# Patient Record
Sex: Female | Born: 1955 | Race: Black or African American | Hispanic: No | Smoking: Former smoker
Health system: Southern US, Community
[De-identification: ages and names within clinical notes are randomized; demographics above are authoritative.]

## PROBLEM LIST (undated history)

## (undated) DIAGNOSIS — C50919 Malignant neoplasm of unspecified site of unspecified female breast: Secondary | ICD-10-CM

## (undated) DIAGNOSIS — I10 Essential (primary) hypertension: Secondary | ICD-10-CM

## (undated) DIAGNOSIS — B1920 Unspecified viral hepatitis C without hepatic coma: Secondary | ICD-10-CM

## (undated) DIAGNOSIS — Z923 Personal history of irradiation: Secondary | ICD-10-CM

## (undated) DIAGNOSIS — K219 Gastro-esophageal reflux disease without esophagitis: Secondary | ICD-10-CM

## (undated) DIAGNOSIS — A150 Tuberculosis of lung: Secondary | ICD-10-CM

## (undated) HISTORY — PX: TUBAL LIGATION: SHX77

## (undated) HISTORY — PX: MANDIBLE FRACTURE SURGERY: SHX706

## (undated) HISTORY — DX: Essential (primary) hypertension: I10

## (undated) HISTORY — PX: BREAST SURGERY: SHX581

## (undated) HISTORY — PX: THYROID CYST EXCISION: SHX2511

## (undated) HISTORY — DX: Unspecified viral hepatitis C without hepatic coma: B19.20

## (undated) HISTORY — DX: Malignant neoplasm of unspecified site of unspecified female breast: C50.919

## (undated) HISTORY — DX: Tuberculosis of lung: A15.0

---

## 1998-06-18 ENCOUNTER — Encounter: Payer: Self-pay | Admitting: Family Medicine

## 1998-06-18 ENCOUNTER — Ambulatory Visit (HOSPITAL_COMMUNITY): Admission: RE | Admit: 1998-06-18 | Discharge: 1998-06-18 | Payer: Self-pay | Admitting: Family Medicine

## 1999-01-17 ENCOUNTER — Ambulatory Visit (HOSPITAL_COMMUNITY): Admission: RE | Admit: 1999-01-17 | Discharge: 1999-01-17 | Payer: Self-pay | Admitting: Family Medicine

## 1999-01-17 ENCOUNTER — Encounter: Payer: Self-pay | Admitting: Family Medicine

## 1999-01-28 ENCOUNTER — Encounter: Payer: Self-pay | Admitting: Family Medicine

## 1999-01-28 ENCOUNTER — Ambulatory Visit (HOSPITAL_COMMUNITY): Admission: RE | Admit: 1999-01-28 | Discharge: 1999-01-28 | Payer: Self-pay | Admitting: Family Medicine

## 1999-06-13 ENCOUNTER — Encounter: Payer: Self-pay | Admitting: Family Medicine

## 1999-06-13 ENCOUNTER — Encounter: Admission: RE | Admit: 1999-06-13 | Discharge: 1999-06-13 | Payer: Self-pay | Admitting: Family Medicine

## 2000-06-07 ENCOUNTER — Other Ambulatory Visit: Admission: RE | Admit: 2000-06-07 | Discharge: 2000-06-07 | Payer: Self-pay | Admitting: Otolaryngology

## 2000-07-16 ENCOUNTER — Ambulatory Visit (HOSPITAL_BASED_OUTPATIENT_CLINIC_OR_DEPARTMENT_OTHER): Admission: RE | Admit: 2000-07-16 | Discharge: 2000-07-16 | Payer: Self-pay | Admitting: Otolaryngology

## 2000-07-16 ENCOUNTER — Encounter (INDEPENDENT_AMBULATORY_CARE_PROVIDER_SITE_OTHER): Payer: Self-pay | Admitting: Specialist

## 2005-02-14 ENCOUNTER — Ambulatory Visit (HOSPITAL_COMMUNITY): Admission: RE | Admit: 2005-02-14 | Discharge: 2005-02-14 | Payer: Self-pay | Admitting: Family Medicine

## 2005-06-29 ENCOUNTER — Other Ambulatory Visit: Admission: RE | Admit: 2005-06-29 | Discharge: 2005-06-29 | Payer: Self-pay | Admitting: Family Medicine

## 2007-04-28 ENCOUNTER — Observation Stay (HOSPITAL_COMMUNITY): Admission: EM | Admit: 2007-04-28 | Discharge: 2007-04-30 | Payer: Self-pay | Admitting: Emergency Medicine

## 2008-12-01 ENCOUNTER — Encounter: Admission: RE | Admit: 2008-12-01 | Discharge: 2008-12-01 | Payer: Self-pay | Admitting: Family Medicine

## 2009-05-10 ENCOUNTER — Other Ambulatory Visit: Admission: RE | Admit: 2009-05-10 | Discharge: 2009-05-10 | Payer: Self-pay | Admitting: Family Medicine

## 2009-08-26 ENCOUNTER — Ambulatory Visit: Payer: Self-pay | Admitting: Gastroenterology

## 2009-09-29 ENCOUNTER — Ambulatory Visit (HOSPITAL_COMMUNITY): Admission: RE | Admit: 2009-09-29 | Discharge: 2009-09-29 | Payer: Self-pay | Admitting: Gastroenterology

## 2009-10-28 ENCOUNTER — Ambulatory Visit: Payer: Self-pay | Admitting: Gastroenterology

## 2010-07-11 LAB — PROTIME-INR
INR: 0.92 (ref 0.00–1.49)
Prothrombin Time: 12.3 seconds (ref 11.6–15.2)

## 2010-07-11 LAB — CBC
HCT: 44.1 % (ref 36.0–46.0)
Hemoglobin: 14.6 g/dL (ref 12.0–15.0)
MCHC: 33.2 g/dL (ref 30.0–36.0)
MCV: 93.4 fL (ref 78.0–100.0)
Platelets: 130 10*3/uL — ABNORMAL LOW (ref 150–400)
RBC: 4.72 MIL/uL (ref 3.87–5.11)
RDW: 12.1 % (ref 11.5–15.5)
WBC: 7.2 10*3/uL (ref 4.0–10.5)

## 2010-07-11 LAB — APTT: aPTT: 29 seconds (ref 24–37)

## 2010-07-11 LAB — GLUCOSE, CAPILLARY
Glucose-Capillary: 88 mg/dL (ref 70–99)
Glucose-Capillary: 91 mg/dL (ref 70–99)

## 2010-09-06 NOTE — H&P (Signed)
NAME:  Sharon, Nguyen NO.:  [id number]   MEDICAL RECORD NO.:  [id number]          PATIENT TYPE:  EMS   LOCATION:  MAJO                         FACILITY:  [facility]   PHYSICIAN:  Marlan Palau, M.D.  DATE OF BIRTH:  November 21, 1955   DATE OF ADMISSION:  04/28/2007  DATE OF DISCHARGE:                              HISTORY & PHYSICAL   PRESENT ILLNESS:  Sharon Nguyen is a 55 year old right-handed black female  born Jun 06, 1955, with a history of hypertension.  This patient  comes to the [facility] emergency room noting onset around 5:30 p.m. of  some left neck and chest pain.  The patient claims to have waves of  chest pain coming on that last about 5 minutes and then spread to the  neck, into the head and into the back of the shoulder on the left.  The  patient may have some discomfort into the left hand with some numbness  in the hand but also feels somewhat heavy in the left arm but also has  some discomfort or numbness sensation down the left side of the body.  The patient has had EKG that was normal and a Code Stroke was called  because of the left-sided symptoms.  CT scan of the brain was done  and  is unremarkable.  The patient has an NIH Stroke Scale score of 2 for  some altered sensation in the left hand and some drift in the left leg.  The patient denies any visual field change, speech changes, swallowing  problems, gait disturbance.   PAST MEDICAL HISTORY:  1. New onset of left-sided neck, shoulder, chest pain, left hand      numbness.  NIH Stroke Scale of 2.  Cause of the above symptoms      unclear.  2. Hypertension.  3. 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  4. Bilateral great toe surgery.  5. Bilateral tubal ligation.  6. Questionable history of atrial fibrillation.  The patient was told      she had an irregular heartbeat in the past.   PRIMARY PHYSICIAN:  Molly Maduro A. Nicholos Johns, M.D.   The patient is on:  1. Amlodipine 10 mg daily.  2.  Lisinopril/HCT 20/25 mg one daily.   Does not smoke.  Drinks alcohol on occasion.   Has no known allergies.   SOCIAL HISTORY:  This patient is living in the [place], [place], area.  Is married and has four sons, who are alive and well.  Works at [employer].   FAMILY MEDICAL HISTORY:  The mother is alive.  Father died of cancer and  stroke.  The patient has eight brothers and sisters, some with  hypertension.   REVIEW OF SYSTEMS:  Notable for no recent fevers, chills.  The patient  does have some left-sided the head pain, left neck pain.  Denies any  shortness of breath.  Does have chest pain.  Denies abdominal pain,  nausea, vomiting, troubles controlling bowels or bladder, blackout  episodes, seizure events.   PHYSICAL EXAMINATION:  VITALS:  Blood pressure is 150/99, heart rate 92,  respiratory rate 20, temperature afebrile.  GENERAL:  The patient is a fairly well-developed black female who is  alert and cooperative at the time of examination.  HEENT:  Head is atraumatic.  Eyes: Pupils equal, round and react to  light.  Discs are flat bilaterally.  NECK:  Supple.  No carotid bruits noted.  RESPIRATORY:  Clear.  CARDIOVASCULAR:  A regular rate and rhythm.  No obvious murmurs or rubs  noted.  EXTREMITIES:  Without significant edema.  NEUROLOGIC:  Cranial nerves as above.  Facial symmetry is present.  The  patient has good sensation of the face to pinprick and soft touch  bilaterally.  Has good strength of the facial muscles and the muscles of  head turning and shoulder shrug bilaterally.  Speech is well-enunciated  and not aphasic.  Extraocular movements, again, are full.  Visual fields  are full.  Motor testing reveals fairly good strength in all fours.  The  patient has some difficulty lifting the left leg, however.  Some drift  was noted on the left leg.  No drift was noted on the left arm.  On  sensory examination the patient had decreased pinprick and  vibratory  sensation on the left hand, normal on the right, some decreased  vibratory sensation in the left leg as compared to the right, otherwise  symmetric pinprick sensation on the legs.  The patient has good finger-  nose-finger, heel-to-shin.  Gait was not tested.  Deep tendon reflexes  symmetric and normal.  Toes downgoing bilaterally.  NIH stroke Scale  Score  of 2.  No aphasia noted.   LABORATORY VALUES:  Notable for a white count of 6.9, hemoglobin of  14.6, hematocrit 43.5, MCV of 89.7, platelets of 155.  INR of 0.9.  Sodium of 140, potassium 3.3, chloride of 101, CO2 of 28, glucose of 92,  BUN of 14, creatinine 0.91, total bili 0.5, alkaline phosphatase of 69,  SGOT of 49, SGPT of 67, total protein of 7.9, albumin of 4.3, calcium  10.0.  CK of 113, MB fraction 1.7, troponin-I 0.02, MB fraction less  than 1.   CT of the head as above.   IMPRESSION:  1. Onset of left-sided chest pain, shoulder pain, and some left-sided      symptoms of heaviness, etiology unclear.  2. History of hypertension.   This patient has developed symptoms of chest pain that is of unclear  cause.  EKG is completely normal.  Cardiac enzymes are negative.  There  does not appear to be a definite cardiac source of the above chest pain.  Need to rule out neck pathology or even demyelinating disease.  I  suppose stroke is a consideration but I think is less likely.  Will  pursue further workup at this point.   PLAN:  1. Admission to [facility].  2. MRI of the brain with intracranial MRI angiogram.  3. MRI scan of the cervical spine with and without gadolinium.  4. Admission blood.  5. Aspirin therapy.   We will follow the patient's clinical course while in-house.  The  patient claims she has had some occasional bouts of left-sided chest  pain since Thanksgiving of 2008.      Marlan Palau, M.D.  Electronically Signed     CKW/MEDQ  D:  04/28/2007  T:  04/29/2007  Job:  [id number]    cc:   Molly Maduro A. Nicholos Johns, M.D.  [facility]

## 2010-09-09 NOTE — Op Note (Signed)
[facility]  Patient:    Sharon Nguyen, Sharon Nguyen                         MRN: [id number] Proc. Date: 07/16/00 Adm. Date:  [id number] Attending:  Serena Colonel H                           Operative Report  PREOPERATIVE DIAGNOSIS:  Left supraclavicular neck mass.  POSTOPERATIVE DIAGNOSIS:  Left supraclavicular neck mass.  PROCEDURE:  Excisional biopsy, left supraclavicular lymph node.  SURGEON:  Jefry H. Pollyann Kennedy, M.D.  ANESTHESIA:  General endotracheal anesthesia was used.  COMPLICATIONS:  No complications.  FINDINGS:  Conglomeration of matted, enlarged, firm lymph nodes, left supraclavicular area.  ESTIMATED BLOOD LOSS:  Less than 5 cc.  FROZEN SECTION DIAGNOSIS:  Granulomatous tissue.  No evidence of malignancy or lymphoma, and final diagnosis deferred until permanent section evaluation and cultures.  HISTORY:  This is a 56 year old lady with a several-month history of slowly enlarging left supraclavicular neck mass.  Fine needle aspiration biopsy revealed granulomatous-type cells.  Risks, benefits, alternatives, and complications of the procedure were explained to the patient, who seemed to understand and agreed to surgery.  DESCRIPTION OF PROCEDURE:  The patient was taken to the operating room and placed on the operating table in supine position.  Following induction of general endotracheal anesthesia, the neck was prepped and draped in a standard fashion.  A 3 cm transverse incision just above the left clavicle was created with a 15 scalpel.  Electrocautery was used to dissect through subcutaneous tissue and fascia and through the fibers of the platysma muscle.  The deep fascia was divided, and the lymph nodes were identified and carefully dissected from surrounding tissue and sent for pathologic evaluation. Electrocautery was used for hemostasis.  The wound was closed in layers using 4-0 chromic in the deep layer and the subcutaneous layer.  The  skin was reapproximated with benzoin and Steri-Strips.  The patient was then awakened, transferred to recovery in stable condition. DD:  07/16/00 TD:  07/16/00 Job: [id number]

## 2010-09-09 NOTE — Discharge Summary (Signed)
NAME:  Sharon Nguyen, Sharon Nguyen NO.:  [id number]   MEDICAL RECORD NO.:  [id number]          PATIENT TYPE:  INP   LOCATION:  6733                         FACILITY:  [facility]   PHYSICIAN:  Marlan Palau, M.D.  DATE OF BIRTH:  1955/12/23   DATE OF ADMISSION:  04/28/2007  DATE OF DISCHARGE:  04/30/2007                               DISCHARGE SUMMARY   ADMISSION DIAGNOSIS:  1. Left-sided neck, shoulder, chest pain, left hand numbness, etiology      unclear.  2. Hypertension.  3. Questionable history of atrial fibrillation.   DISCHARGE DIAGNOSIS:  1. Left-sided neck, shoulder, chest pain, left hand numbness, etiology      unclear.  2. Hypertension.  3. Questionable history of atrial fibrillation.   PROCEDURES DURING THIS ADMISSION:  Include:  1. CT of the head.  2. MRI of the brain.  3. MRI angiogram.  4. Cervical spine MRI.  5. Carotid Doppler study.  6. 2-D echocardiogram.   COMPLICATIONS OF ABOVE PROCEDURES:  None.   HISTORY OF PRESENT ILLNESS:  Sharon Nguyen is a 55 year old right-handed  black female born 04-25-1955 with a history of hypertension.  The  patient came to the [facility] emergency room complaining of some left  neck and chest pain and had some pain into the back of the shoulder on  the left as well.  Chest pain would come in waves lasting about 5  minutes and then dissipated.  The patient also has a sensation of  numbness in the left side of the body, questionable weakness on left  side a code stroke was called on admission. CT scan of brain was done  and was unremarkable.  NIH stroke scale score was two.  The patient was  brought in the hospital for further evaluation.  EKG was normal.  Cardiac enzymes were normal.   PAST MEDICAL HISTORY:  1. New onset left-sided neck pain and shoulder, some left hand      numbness, etiology unclear.  2. Hypertension.  3. Lymph node resection for granuloma in the past.  4. Bilateral great toe surgery.  5.  Bilateral tubal ligation.  6. Questionable history atrial fibrillation in the past.   MEDICATIONS ON ADMISSION:  Include:  1. Amlodipine 10 mg daily.  2. Lisinopril/hydrochlorothiazide 20/25 mg one daily.   SOCIAL HISTORY:  The patient does not smoke, drinks alcohol on occasion  has no known allergies.   Please refer to history and physical dictation summary for social  history, family history or review of systems and physical examination.   LABORATORY VALUES:  Are notable for a with white count of 6.9, a  hemoglobin 14.6, hematocrit 43.5, MCV of 89.7, platelets 155, INR of  0.9, sodium of 140, potassium 3.3, chloride of 101, CO2 28, glucose of  92, BUN of 14, creatinine of 0.91, total bili 0.5, alkaline phosphatase  of 69, SGOT of 49, SGPT of 67, total protein 7.9, albumin of 4.3,  calcium of 10, phos of 4.2, CK 92, MB fraction 1.1 and troponin I 0.03.   ANA is negative.  TSH 1.698.  Vitamin B12 level of 497.   HOSPITAL COURSE:  This patient was admitted to [facility] for  evaluation of the above symptoms.  The patient was set up for an MRI  study of the brain that was unremarkable.  MRI angiogram of intracranial  vessels showed questionable loss in the distal A1 segment that was felt  to be artifactual. MRI of the cervical spine was also done showing a  central disk protrusion with moderate central canal stenosis, right  greater than left at C3 level some spurring at C4-5 level with bi-  foraminal narrowing, moderate right and mild left foraminal narrowing at  C5-6 level, mild left foraminal narrowing C6-7 level.  The patient was  set up for CT angiogram of the chest to rule out pulmonary embolus, but  this was unremarkable.  Some small left upper lobe nodules were noted.  The patient underwent a 2-D echocardiogram and this revealed an ejection  fraction of 60 to 65%, mild mitral valvular regurgitation.  No embolic  source was noted.  A carotid Doppler study was also  performed and shows  mild intimal wall changes in the common carotid arteries bilaterally.  Vertebral artery flow was antegrade bilaterally.  No evidence of  internal carotid artery stenosis was seen on either side the patient was  subsequently discharged taking amlodipine 10 mg daily,  lisinopril/hydrochlorothiazide 20/25 mg daily.  The patient is placed on  potassium supplementation 10 mEq, 30 tablets given, and three refills  take one a day.  The patient was placed on aspirin therapy 81 mg daily.  The patient will follow-up with her primary care Dr. Fulton Mole.  The  patient will be seen at.  [facility] if needed. At  time of discharge, the patient is bright, alert, cooperative, has no  focal deficits chest pain had resolved.      Marlan Palau, M.D.  Electronically Signed     CKW/MEDQ  D:  05/01/2007  T:  05/02/2007  Job:  [id number]   cc:   [facility]  Fulton Mole, M.D.

## 2011-01-11 LAB — CBC
HCT: 43.5
Hemoglobin: 14.6
MCHC: 33.7
MCV: 89.7
Platelets: 155
RBC: 4.84
RDW: 12.8
WBC: 6.9

## 2011-01-11 LAB — ANGIOTENSIN CONVERTING ENZYME: Angiotensin-Converting Enzyme: 21 U/L (ref 9–67)

## 2011-01-11 LAB — DIFFERENTIAL
Basophils Absolute: 0
Basophils Relative: 0
Eosinophils Absolute: 0.2
Eosinophils Relative: 2
Lymphocytes Relative: 28
Lymphs Abs: 1.9
Monocytes Absolute: 0.6
Monocytes Relative: 8
Neutro Abs: 4.3
Neutrophils Relative %: 62

## 2011-01-11 LAB — COMPREHENSIVE METABOLIC PANEL
ALT: 67 — ABNORMAL HIGH
AST: 49 — ABNORMAL HIGH
Albumin: 4.3
Alkaline Phosphatase: 69
BUN: 14
CO2: 28
Calcium: 10
Chloride: 101
Creatinine, Ser: 0.91
GFR calc non Af Amer: >60
Glucose, Bld: 92
Potassium: 3.3 — ABNORMAL LOW
Sodium: 140
Total Bilirubin: 0.5
Total Protein: 7.9

## 2011-01-11 LAB — BASIC METABOLIC PANEL
BUN: 10
BUN: 11
CO2: 27
CO2: 27
Calcium: 9.1
Calcium: 9.2
Chloride: 102
Chloride: 106
Creatinine, Ser: 0.71
Creatinine, Ser: 0.81
GFR calc Af Amer: >60
GFR calc non Af Amer: >60
GFR calc non Af Amer: >60
Glucose, Bld: 90
Glucose, Bld: 94
Potassium: 3.4 — ABNORMAL LOW
Potassium: 3.8
Sodium: 134 — ABNORMAL LOW
Sodium: 141

## 2011-01-11 LAB — SEDIMENTATION RATE: Sed Rate: 6

## 2011-01-11 LAB — PROTIME-INR
INR: 0.9
Prothrombin Time: 12.1

## 2011-01-11 LAB — RENAL FUNCTION PANEL
Albumin: 3.9
BUN: 10
CO2: 29
Calcium: 9.4
Chloride: 105
Creatinine, Ser: 0.8
GFR calc Af Amer: >60
GFR calc non Af Amer: >60
Glucose, Bld: 87
Phosphorus: 4.2
Potassium: 3.6
Sodium: 140

## 2011-01-11 LAB — POCT CARDIAC MARKERS
CKMB, poc: 1.1
CKMB, poc: <1 — ABNORMAL LOW
Myoglobin, poc: 60.3
Myoglobin, poc: 87.5
Operator id: 294341
Operator id: 294341
Troponin i, poc: <0.05
Troponin i, poc: <0.05

## 2011-01-11 LAB — TROPONIN I: Troponin I: 0.03

## 2011-01-11 LAB — APTT: aPTT: 27

## 2011-01-11 LAB — CK TOTAL AND CKMB (NOT AT ARMC)
CK, MB: 1.1
CK, MB: 1.7
Relative Index: 1.5
Relative Index: INVALID
Total CK: 113
Total CK: 92

## 2011-01-11 LAB — TSH: TSH: 1.698

## 2011-01-11 LAB — ANA: Anti Nuclear Antibody(ANA): NEGATIVE

## 2011-01-11 LAB — VITAMIN B12: Vitamin B-12: 497 (ref 211–911)

## 2011-02-27 ENCOUNTER — Other Ambulatory Visit (HOSPITAL_COMMUNITY): Payer: Self-pay | Admitting: Family Medicine

## 2011-02-27 DIAGNOSIS — Z1231 Encounter for screening mammogram for malignant neoplasm of breast: Secondary | ICD-10-CM

## 2011-03-08 ENCOUNTER — Other Ambulatory Visit (HOSPITAL_COMMUNITY)
Admission: RE | Admit: 2011-03-08 | Discharge: 2011-03-08 | Disposition: A | Discharge status: Home / Self Care | Payer: BC Managed Care – PPO | Source: Ambulatory Visit | Attending: Family Medicine | Admitting: Family Medicine

## 2011-03-08 ENCOUNTER — Other Ambulatory Visit: Payer: Self-pay | Admitting: Family Medicine

## 2011-03-08 DIAGNOSIS — Z01419 Encounter for gynecological examination (general) (routine) without abnormal findings: Secondary | ICD-10-CM | POA: Insufficient documentation

## 2011-03-24 ENCOUNTER — Ambulatory Visit (HOSPITAL_COMMUNITY)
Admission: RE | Admit: 2011-03-24 | Discharge: 2011-03-24 | Disposition: A | Discharge status: Home / Self Care | Payer: BC Managed Care – PPO | Source: Ambulatory Visit | Attending: Family Medicine | Admitting: Family Medicine

## 2011-03-24 DIAGNOSIS — Z1231 Encounter for screening mammogram for malignant neoplasm of breast: Secondary | ICD-10-CM | POA: Insufficient documentation

## 2011-04-03 ENCOUNTER — Other Ambulatory Visit: Payer: Self-pay | Admitting: Family Medicine

## 2011-04-03 DIAGNOSIS — R928 Other abnormal and inconclusive findings on diagnostic imaging of breast: Secondary | ICD-10-CM

## 2011-04-13 ENCOUNTER — Inpatient Hospital Stay: Admission: RE | Admit: 2011-04-13 | Payer: BC Managed Care – PPO | Source: Ambulatory Visit

## 2011-05-03 ENCOUNTER — Ambulatory Visit
Admission: RE | Admit: 2011-05-03 | Discharge: 2011-05-03 | Disposition: A | Discharge status: Home / Self Care | Payer: BC Managed Care – PPO | Source: Ambulatory Visit | Attending: Family Medicine | Admitting: Family Medicine

## 2011-05-03 DIAGNOSIS — R928 Other abnormal and inconclusive findings on diagnostic imaging of breast: Secondary | ICD-10-CM

## 2011-10-05 ENCOUNTER — Other Ambulatory Visit: Payer: Self-pay | Admitting: Family Medicine

## 2011-10-05 DIAGNOSIS — N6310 Unspecified lump in the right breast, unspecified quadrant: Secondary | ICD-10-CM

## 2011-11-01 ENCOUNTER — Ambulatory Visit
Admission: RE | Admit: 2011-11-01 | Discharge: 2011-11-01 | Disposition: A | Discharge status: Home / Self Care | Payer: 59 | Source: Ambulatory Visit | Attending: Family Medicine | Admitting: Family Medicine

## 2011-11-01 DIAGNOSIS — N6310 Unspecified lump in the right breast, unspecified quadrant: Secondary | ICD-10-CM

## 2012-04-19 ENCOUNTER — Other Ambulatory Visit: Payer: Self-pay | Admitting: Family Medicine

## 2012-04-19 DIAGNOSIS — R922 Inconclusive mammogram: Secondary | ICD-10-CM

## 2012-04-24 HISTORY — PX: BREAST LUMPECTOMY: SHX2

## 2012-04-30 ENCOUNTER — Other Ambulatory Visit: Payer: Self-pay | Admitting: Family Medicine

## 2012-04-30 ENCOUNTER — Ambulatory Visit
Admission: RE | Admit: 2012-04-30 | Discharge: 2012-04-30 | Disposition: A | Discharge status: Home / Self Care | Payer: 59 | Source: Ambulatory Visit | Attending: Family Medicine | Admitting: Family Medicine

## 2012-04-30 DIAGNOSIS — R922 Inconclusive mammogram: Secondary | ICD-10-CM

## 2012-04-30 DIAGNOSIS — R9230 Dense breasts, unspecified: Secondary | ICD-10-CM

## 2012-04-30 DIAGNOSIS — N63 Unspecified lump in unspecified breast: Secondary | ICD-10-CM

## 2012-06-04 ENCOUNTER — Inpatient Hospital Stay: Admission: RE | Admit: 2012-06-04 | Payer: 59 | Source: Ambulatory Visit

## 2012-06-10 ENCOUNTER — Ambulatory Visit
Admission: RE | Admit: 2012-06-10 | Discharge: 2012-06-10 | Disposition: A | Discharge status: Home / Self Care | Payer: 59 | Source: Ambulatory Visit | Attending: Family Medicine | Admitting: Family Medicine

## 2012-06-10 DIAGNOSIS — N63 Unspecified lump in unspecified breast: Secondary | ICD-10-CM

## 2012-06-13 ENCOUNTER — Telehealth: Payer: Self-pay | Admitting: *Deleted

## 2012-06-13 DIAGNOSIS — C50519 Malignant neoplasm of lower-outer quadrant of unspecified female breast: Secondary | ICD-10-CM | POA: Insufficient documentation

## 2012-06-13 DIAGNOSIS — C50511 Malignant neoplasm of lower-outer quadrant of right female breast: Secondary | ICD-10-CM

## 2012-06-13 NOTE — Telephone Encounter (Signed)
Confirmed [facility] for 06/19/12 at 1200 .  Instructions and contact information given. 

## 2012-06-19 ENCOUNTER — Other Ambulatory Visit: Payer: 59 | Admitting: Lab

## 2012-06-19 ENCOUNTER — Ambulatory Visit: Payer: 59

## 2012-06-19 ENCOUNTER — Ambulatory Visit: Payer: 59 | Admitting: Radiation Oncology

## 2012-06-19 ENCOUNTER — Ambulatory Visit (INDEPENDENT_AMBULATORY_CARE_PROVIDER_SITE_OTHER): Payer: 59 | Admitting: General Surgery

## 2012-06-19 ENCOUNTER — Ambulatory Visit: Payer: 59 | Admitting: Oncology

## 2012-06-19 ENCOUNTER — Telehealth: Payer: Self-pay | Admitting: *Deleted

## 2012-06-19 ENCOUNTER — Ambulatory Visit: Payer: 59 | Attending: General Surgery | Admitting: Physical Therapy

## 2012-06-19 NOTE — Telephone Encounter (Signed)
Pt did not show up for her appt so I called to see what was going on and she stated that she wanted to reschedule.  Confirmed 06/26/12 am clinic w/ pt.  Informed team.

## 2012-06-26 ENCOUNTER — Ambulatory Visit (HOSPITAL_BASED_OUTPATIENT_CLINIC_OR_DEPARTMENT_OTHER): Payer: 59 | Admitting: Surgery

## 2012-06-26 ENCOUNTER — Ambulatory Visit (HOSPITAL_BASED_OUTPATIENT_CLINIC_OR_DEPARTMENT_OTHER): Payer: 59 | Admitting: Oncology

## 2012-06-26 ENCOUNTER — Encounter: Payer: Self-pay | Admitting: *Deleted

## 2012-06-26 ENCOUNTER — Encounter: Payer: Self-pay | Admitting: Oncology

## 2012-06-26 ENCOUNTER — Other Ambulatory Visit (HOSPITAL_BASED_OUTPATIENT_CLINIC_OR_DEPARTMENT_OTHER): Payer: 59 | Admitting: Lab

## 2012-06-26 ENCOUNTER — Ambulatory Visit: Payer: 59 | Attending: Surgery | Admitting: Physical Therapy

## 2012-06-26 ENCOUNTER — Ambulatory Visit: Payer: 59

## 2012-06-26 ENCOUNTER — Ambulatory Visit
Admission: RE | Admit: 2012-06-26 | Discharge: 2012-06-26 | Disposition: A | Discharge status: Home / Self Care | Payer: 59 | Source: Ambulatory Visit | Attending: Radiation Oncology | Admitting: Radiation Oncology

## 2012-06-26 VITALS — BP 147/91 | HR 80 | Temp 98.5°F | Resp 20 | Ht 67.25 in | Wt 159.7 lb

## 2012-06-26 DIAGNOSIS — C50511 Malignant neoplasm of lower-outer quadrant of right female breast: Secondary | ICD-10-CM

## 2012-06-26 DIAGNOSIS — C50519 Malignant neoplasm of lower-outer quadrant of unspecified female breast: Secondary | ICD-10-CM

## 2012-06-26 DIAGNOSIS — Z170 Estrogen receptor positive status [ER+]: Secondary | ICD-10-CM

## 2012-06-26 LAB — CBC WITH DIFFERENTIAL/PLATELET
BASO%: 0.9 % (ref 0.0–2.0)
Basophils Absolute: 0.1 10*3/uL (ref 0.0–0.1)
EOS%: 3.9 % (ref 0.0–7.0)
Eosinophils Absolute: 0.2 10*3/uL (ref 0.0–0.5)
HCT: 39.5 % (ref 34.8–46.6)
HGB: 12.6 g/dL (ref 11.6–15.9)
LYMPH%: 23.6 % (ref 14.0–49.7)
MCH: 27.7 pg (ref 25.1–34.0)
MCHC: 31.9 g/dL (ref 31.5–36.0)
MCV: 86.9 fL (ref 79.5–101.0)
MONO#: 0.4 10*3/uL (ref 0.1–0.9)
MONO%: 6.5 % (ref 0.0–14.0)
NEUT#: 3.8 10*3/uL (ref 1.5–6.5)
NEUT%: 65.1 % (ref 38.4–76.8)
Platelets: 156 10*3/uL (ref 145–400)
RBC: 4.54 10*6/uL (ref 3.70–5.45)
RDW: 13.8 % (ref 11.2–14.5)
WBC: 5.9 10*3/uL (ref 3.9–10.3)
lymph#: 1.4 10*3/uL (ref 0.9–3.3)

## 2012-06-26 LAB — COMPREHENSIVE METABOLIC PANEL (CC13)
ALT: 14 U/L (ref 0–55)
AST: 16 U/L (ref 5–34)
Albumin: 4.1 g/dL (ref 3.5–5.0)
Alkaline Phosphatase: 83 U/L (ref 40–150)
BUN: 12.9 mg/dL (ref 7.0–26.0)
CO2: 28 mEq/L (ref 22–29)
Calcium: 10.1 mg/dL (ref 8.4–10.4)
Chloride: 106 mEq/L (ref 98–107)
Creatinine: 0.8 mg/dL (ref 0.6–1.1)
Glucose: 93 mg/dl (ref 70–99)
Potassium: 3.4 mEq/L — ABNORMAL LOW (ref 3.5–5.1)
Sodium: 143 mEq/L (ref 136–145)
Total Bilirubin: 0.49 mg/dL (ref 0.20–1.20)
Total Protein: 8 g/dL (ref 6.4–8.3)

## 2012-06-26 LAB — CANCER ANTIGEN 27.29: CA 27.29: 19 U/mL (ref 0–39)

## 2012-06-26 NOTE — Progress Notes (Signed)
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 female who is seen today for the courtesy of Dr. Ezzard Standing at the [facility]. for evaluation of her T1 N0 invasive mammary carcinoma of the right breast. On 04/30/2012 she underwent short interval followup of a nodule along the right lower quadrant of the posterior right breast. Interval nodule was seen again along the right lower quadrant. Ultrasound she was found to have a cyst at 6:00 and a 4 x 3 x 4 mm hypoechoic nodule at 8:00, 8 cm from the right nipple. This was felt to be smaller than a nodule noted on the mammogram and was not felt to correlate to the mammographic finding. A stereotactic biopsy was recommended and performed on 06/10/2012. This was diagnostic for invasive mammary carcinoma with extravasated mucin. The tumor was felt to be low-grade. The tumor strongly ER/PR positive at 100% with a proliferation marker of 22%. Breast MR is not felt to be indicated. She seen today with Dr. Ezzard Standing and Dr. Welton Flakes.  PREVIOUS RADIATION THERAPY: No   PAST MEDICAL HISTORY:  has a past medical history of Breast cancer; Hypertension; Hepatitis C; and TB (pulmonary tuberculosis).     PAST SURGICAL HISTORY: No past surgical history on file.   FAMILY HISTORY: Her father died at age 54 from mesothelioma. Her mother is alive and well at age 56. Her husband is currently under hospice for cholangiocarcinoma.  SOCIAL HISTORY:  reports that she has quit smoking. She does not have any smokeless tobacco history on file. She reports that she does not drink alcohol or use illicit drugs. Married, 4 children. She  works as an Arts administrator.   ALLERGIES: Review of patient's allergies indicates no known allergies.   MEDICATIONS:  No current outpatient prescriptions on file.   No current facility-administered medications for this encounter.     REVIEW OF SYSTEMS:  Pertinent items are noted in HPI.    PHYSICAL EXAM: Alert and oriented 57 year old African American female appearing younger than her stated age. Wt Readings from Last 3 Encounters:  06/26/12 159 lb 11.2 oz (72.439 kg)   Temp Readings from Last 3 Encounters:  06/26/12 98.5 F (36.9 C) Oral   BP Readings from Last 3 Encounters:  06/26/12 147/91   Pulse Readings from Last 3 Encounters:  06/26/12 80   Head and neck examination: Grossly unremarkable. Nodes: Without palpable cervical, supraclavicular, or axillary lymphadenopathy. Chest: Lungs clear. Back: Without spinal or CVA tenderness. Heart: Regular rate rhythm. Breasts: There is a biopsy wound at approximately 8:00 along the lower outer quadrant of the right breast. No masses are appreciated. Left breast without masses or lesions. Abdomen without hepatomegaly. Extremities: Without edema. Neurologic examination: Grossly nonfocal.    LABORATORY DATA:  Lab Results  Component Value Date   WBC 5.9 06/26/2012   HGB 12.6 06/26/2012   HCT 39.5 06/26/2012   MCV 86.9 06/26/2012   PLT 156 06/26/2012   Lab Results  Component Value Date   NA 143 06/26/2012   K 3.4* 06/26/2012   CL 106 06/26/2012  CO2 28 06/26/2012   Lab Results  Component Value Date   ALT 14 06/26/2012   AST 16 06/26/2012   ALKPHOS 83 06/26/2012   BILITOT 0.49 06/26/2012      IMPRESSION: Total stage I (T1, N0, M0) invasive mammary carcinoma of the right breast. We discussed local management options which include mastectomy versus partial mastectomy/lumpectomy followed by radiation therapy. She is interested in breast preservation. We also discussed short course/hypo-fractionated treatment versus standard fractionation. I feel that  she would be a good candidate for hypo-fractionated radiation therapy. We discussed the potential acute and late toxicities of radiation therapy. She'll be scheduled for definitive surgery and Oncotype DX testing.    PLAN: As discussed above. I would like to see her to 3 weeks following her partial mastectomy/sentinel lymph node biopsy   I spent 40 minutes minutes face to face with the patient and more than 50% of that time was spent in counseling and/or coordination of care.

## 2012-06-26 NOTE — Progress Notes (Addendum)
Re:   Sharon Nguyen DOB:   01/17/1956 MRN:   [id number]  [facility]  ASSESSMENT AND PLAN: 1.  Right Breast Cancer  Mammogram - 1.4 cm  MRI - 06/21/2012  ER - , PR - , Her2Neu - neg, K67 - 22%  Oncologists:  Drs. Dayton Scrape and [facility].  I discussed the options for breast cancer treatment with the patient. The patient is in the multidisciplinary clinic to discuss a multidisciplinary approach to the treatment of breast cancer, which includes medical oncology and radiation oncology.  I discussed the surgical options of lumpectomy vs. mastectomy.  She is a candidate for lumpectomy.  I discussed the options of lymph node biopsy.  The treatment plan depends on the pathologic staging of the tumor and the patient's personal wishes.  The risks of surgery include, but are not limited to, bleeding, infection, the need for further surgery, and nerve injury.  The patient has been given literature on the treatment of breast cancer.  Plan:  1.) Lumpectomy (needle loc) and right axillary sentinel lymph node biopsy, 2.) Oncotype  [Patient cancelled surgery for 07/30/2012.  I tried to call patient, but got only answering machine.  DN  07/29/2012]  2.  Hypertension 3.  Hepatitis C - treated through [facility] - May 2011  She says she is disease free 4.  Treated for TB off lymph node biopsy - 2006/07 5.  Husband in hospice for "bile duct cancer"  No chief complaint on file.  REFERRING PHYSICIAN: Lolita Patella, MD  HISTORY OF PRESENT ILLNESS: Sharon Nguyen is a 58 y.o. (DOB: 05-28-1955)  AA  female whose primary care physician is Lolita Patella, MD and comes to the [facility] for right breast cancer.  The patient has had routine mammography. In fact about a year ago, to get her mammograms more frequently. She had a mammogram on 04/30/2012 showed an ill-defined nodule in the right lower outer quadrant. She had a biopsy on 06/10/2012 showed an invasive mammary carcinoma. There is no indication for an  MRI.  Her last period was around 2003 when she was 57 years old. She is not on hormone medication. She's had no prior history of breast disease or breast biopsy.  Her husband is in hospice for a bile duct cancer. He is doing poorly and this occupies much of her time. Because of her husband's illness, she is not told anyone in her family about her breast cancer.   No past medical history on file.   No past surgical history on file.    No current outpatient prescriptions on file.   No current facility-administered medications for this visit.     Allergies no known allergies  REVIEW OF SYSTEMS: Skin:  No history of rash.   Infection: History of TB and Hep C.  She was treated at [facility] for the Hep C, but does not remember the physician's name. Neurologic:  No history of stroke.  No history of seizure.  No history of headaches. Cardiac:  Hypertension x 20 years. No history of heart disease.  No history of prior cardiac catheterization.  No history of seeing a cardiologist. Pulmonary:  Does not smoke cigarettes.  No asthma or bronchitis.  No OSA/CPAP.  Endocrine:  No diabetes. No thyroid disease. Gastrointestinal:  No history of stomach disease.  No history of liver disease.  No history of gall bladder disease.  No history of pancreas disease.  Colonoscopy in 2012 - she can't remember the physician. Urologic:  No  history of kidney stones.  No history of bladder infections. Musculoskeletal:  No history of joint or back disease. Hematologic:  No bleeding disorder.  No history of anemia.  Not anticoagulated. Psycho-social:  The patient is oriented.   The patient has no obvious psychologic or social impairment to understanding our conversation and plan.  For what's going on with her husband, she is pretty calm.  SOCIAL and FAMILY HISTORY: Husband in hospice - has "bile duct cancer". She has 4 sons, 2 in [place], 1 in [place], and 1 in [place].  Her children do not know about her  diagnosis. She works as a Engineer, civil (consulting) at [employer].  PHYSICAL EXAM: There were no vitals taken for this visit.  General: WN thinner AA F who is alert and generally healthy appearing.  HEENT: Normal. Pupils equal. Neck: Supple. No mass.  No thyroid mass.  She has a scar over the medial right clavicle, where she had the biopsy that showed TB in 2006. Lymph Nodes:  No supraclavicular, cervical or axillary nodes. Lungs: Clear to auscultation and symmetric breath sounds. Heart:  RRR. No murmur or rub. Breasts:  Right -  Scar from core biopsy at about 8 o'clock, approx 8 cm from the areola.  I feel a mass, but it is probably from the biopsy.  Left - No mass.  No nipple discharge.  Abdomen: Soft. No mass. No tenderness. No hernia. Normal bowel sounds.  Infraumbilical scar from TL Rectal: Not done. Extremities:  Good strength and ROM  in upper and lower extremities. Neurologic:  Grossly intact to motor and sensory function. Psychiatric: Has normal mood and affect. Behavior is normal.   DATA REVIEWED: Epic and path reports.  Path report to patient.  Sharon Kin, MD,  [facility] [address] [place]  [address]   [place], [place]    [address] Phone:  [phone] FAX:  [phone]

## 2012-06-26 NOTE — Progress Notes (Signed)
RAVON MCILHENNY [id number] 03/30/1956 57 y.o. 06/26/2012 11:43 AM  CC  Elias Else Lyn Hollingshead, MD [facility] [address] [place] [address] Dr. Ovidio Kin Dr. 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   STAGE:   Cancer of lower-outer quadrant of female breast   Primary site: Breast (Right)   Staging method: AJCC 7th Edition   Clinical: Stage IA (T1c, N0, cM0)   Summary: Stage IA (T1c, N0, cM0)  REFERRING PHYSICIAN: Dr. Ovidio Kin  HISTORY OF PRESENT ILLNESS:  CHIANTE PEDEN is a 57 y.o. female.  With multiple medical problems. On 04/30/2012 patient had a short interval followup of a nodule along the right lower quadrant of the posterior right breast. Interval nodule was seen again along the right lower quadrant. She had an ultrasound done that showed a cyst at the 6:00 position and a 4 x 3 x 4 mm hypoechoic nodule at the 8:00 position 8 cm from the nipple. This was felt to be smaller than a nodule noted in the mammogram and did not correlate to the mammographic findings. Patient had a stereotactic biopsy performed on 06/10/2012. The pathology showed a  invasive mammary carcinoma with extravasated mucin. Tumor was low grade ER positive PR +100% with a proliferation marker Ki-67 of 22%. Patient did not undergo a MRI. She herself is without any complaints. She is seen by Dr. Ovidio Kin and Dr. Chipper Herb.  Past Medical History: Past Medical History  Diagnosis Date  . Breast cancer   . Hypertension   . Hepatitis C   . TB (pulmonary tuberculosis)     Past Surgical History: No past surgical history on file.  Family History: No family history on file.  Social History History  Substance Use Topics  . Smoking status: Former Games developer  . Smokeless  tobacco: Not on file  . Alcohol Use: No    Allergies: No Known Allergies  Current Medications: No current outpatient prescriptions on file.   No current facility-administered medications for this visit.    OB/GYN History: Patient had menarche at age 5 she underwent menopause in 2003 she's had 4 to live births first live birth was at 35. She is postmenopausal she has never been on hormone replacement therapy.  Fertility Discussion: Not applicable Prior History of Cancer: No  Health Maintenance:  Colonoscopy yes Bone Density no Last PAP smear 2013  ECOG PERFORMANCE STATUS: 1 - Symptomatic but completely ambulatory  Genetic Counseling/testing: At this time patient is not recommended genetic counseling  REVIEW OF SYSTEMS:  A comprehensive review of systems was negative.  PHYSICAL EXAMINATION: Blood pressure 147/91, pulse 80, temperature 98.5 F (36.9 C), temperature source Oral, resp. rate 20, height 5' 7.25" (1.708 m), weight 159 lb 11.2 oz (72.439 kg).  XBJ:YNWGN, no distress, well nourished and well developed SKIN: skin color, texture, turgor are normal HEAD: Normocephalic EYES: PERRLA, EOMI EARS: External ears normal OROPHARYNX:no exudate  NECK: supple, no adenopathy LYMPH:  no palpable lymphadenopathy BREAST:left breast normal without mass, skin or nipple changes or axillary nodes, and the right side biopsy hematoma is noted LUNGS: clear to auscultation  HEART: regular rate & rhythm ABDOMEN:abdomen soft and no masses or organomegaly BACK: No CVA tenderness EXTREMITIES:no edema, no clubbing, no cyanosis  NEURO: alert & oriented x 3 with fluent speech, no focal motor/sensory  deficits, gait normal, reflexes normal and symmetric     STUDIES/RESULTS: Mm Rt Breast Bx W Loc Dev 1st Lesion Image Bx Spec Stereo Guide  06/11/2012  **ADDENDUM** CREATED: 06/11/2012 13:12:07  Pathology returned as invasive mammary carcinoma with abundant extracellular mucin.  This is  concordant with imaging findings.  The patient was telephoned on 06/11/2012 at and I discussed these results with her.  She denies significant pain, bruising, or bleeding at the biopsy site.  RECOMMENDATION:  The patient has been scheduled at the Multidisciplinary Breast Cancer Clinic at the [facility] on 06/19/2012.  **END ADDENDUM** SIGNED BY: Arnell Sieving, M.D.   06/10/2012  *RADIOLOGY REPORT*  Clinical Data:  Right breast mass  STEREOTACTIC-GUIDED VACUUM ASSISTED BIOPSY OF THE RIGHT BREAST AND SPECIMEN RADIOGRAPH  Comparison: Previous exams.  I met with the patient and we discussed the procedure of stereotactic-guided biopsy, including benefits and alternatives. We discussed the high likelihood of a successful procedure. We discussed the risks of the procedure, including infection, bleeding, tissue injury, clip migration, and inadequate sampling. Informed, written consent was given.  Using sterile technique, 2% lidocaine, stereotactic guidance, and a 9 gauge vacuum assisted device, biopsy was performed of the mass located within the lower outer quadrant of the right breast using a lateral approach. No specimen mammogram was performed as this biopsy was for a noncalcified mass.  At the conclusion of the procedure, a T-shaped tissue marker clip was deployed into the biopsy cavity.  Follow-up 2-view mammogram confirmed clip to be in appropriate position.  IMPRESSION: Stereotactic-guided biopsy of the mass located within the lower outer quadrant right breast as discussed above.  No apparent complications.   Original Report Authenticated By: Rolla Plate, M.D.      LABS:    Chemistry      Component Value Date/Time   NA 143 06/26/2012 0828   NA 141 04/30/2007 0643   K 3.4* 06/26/2012 0828   K 3.8 04/30/2007 0643   CL 106 06/26/2012 0828   CL 106 04/30/2007 0643   CO2 28 06/26/2012 0828   CO2 27 04/30/2007 0643   BUN 12.9 06/26/2012 0828   BUN 10 04/30/2007 0643   CREATININE 0.8 06/26/2012 0828    CREATININE 0.71 04/30/2007 0643      Component Value Date/Time   CALCIUM 10.1 06/26/2012 0828   CALCIUM 9.1 04/30/2007 0643   ALKPHOS 83 06/26/2012 0828   ALKPHOS 69 04/28/2007 1905   AST 16 06/26/2012 0828   AST 49* 04/28/2007 1905   ALT 14 06/26/2012 0828   ALT 67* 04/28/2007 1905   BILITOT 0.49 06/26/2012 0828   BILITOT 0.5 04/28/2007 1905      Lab Results  Component Value Date   WBC 5.9 06/26/2012   HGB 12.6 06/26/2012   HCT 39.5 06/26/2012   MCV 86.9 06/26/2012   PLT 156 06/26/2012   PATHOLOGY: 06/10/12 ADDITIONAL INFORMATION: CHROMOGENIC IN-SITU HYBRIDIZATION Interpretation HER-2/NEU BY CISH - NO AMPLIFICATION OF HER-2 DETECTED. THE RATIO OF HER-2: CEP 17 SIGNALS WAS 1.28. Reference range: Ratio: HER2:CEP17 < 1.8 - gene amplification not observed Ratio: HER2:CEP 17 1.8-2.2 - equivocal result Ratio: HER2:CEP17 > 2.2 - gene amplification observed Jimmy Picket MD Pathologist, Electronic Signature ( Signed 06/14/2012) PROGNOSTIC INDICATORS - ACIS Results IMMUNOHISTOCHEMICAL AND MORPHOMETRIC ANALYSIS BY THE AUTOMATED CELLULAR IMAGING SYSTEM (ACIS) Estrogen Receptor (Negative, <1%): 100%, STRONG STAINING INTENSITY Progesterone Receptor (Negative, <1%): 100%, STRONG STAINING INTENSITY Proliferation Marker Ki67 by M IB-1 (Low<20%): 22% All controls stained appropriately Jimmy Picket  MD Pathologist, Electronic Signature ( Signed 06/14/2012) 1 of 3 FINAL for MARTINIQUE, PIZZIMENTI ([id number]) FINAL DIAGNOSIS Diagnosis Breast, right, needle core biopsy, LOQ - INVASIVE MAMMARY CARCINOMA WITH EXTRAVASATED MUCIN. SEE COMMENT. Microscopic Comment Although the grade of tumor is best assessed at resection, with these biopsies, the invasive tumor is grade I. Breast prognostic studies are pending and will be reported in an addendum. The case is reviewed with Dr. Raynald Blend who concurs. (CRR:gt, 06/11/12)   ASSESSMENT    57 year old female with  #1 new diagnosis of invasive mammary carcinoma of the right  breast that is ER positive PR positive HER-2/neu negative with a Ki-67 of 22%. Patient is a good breast conservation candidate. She was seen by Dr. Ovidio Kin for this. She certainly will require postlumpectomy radiation therapy.  #2 since patient has a disease that is estrogen receptor positive I do think that she would be a candidate for antiestrogen therapy. I have however recommended Oncotype testing to see whether or not she would require chemotherapy as well. Although I do think the likelihood of her needing chemotherapy adjuvantly as low. I discussed the rationale for Oncotype testing.  Clinical Trial Eligibility: No Multidisciplinary conference discussion yes    PLAN:    #1 proceed with lumpectomy and sentinel lymph node biopsy.  #2 we will send off Oncotype testing on her final pathology.  #3 patient will be seen back by me after the results of her Oncotype DX test.       Discussion: Patient is being treated per NCCN breast cancer care guidelines appropriate for stage.I   Thank you so much for allowing me to participate in the care of [employer]. I will continue to follow up the patient with you and assist in her care.  All questions were answered. The patient knows to call the clinic with any problems, questions or concerns. We can certainly see the patient much sooner if necessary.  I spent 60 minutes counseling the patient face to face. The total time spent in the appointment was 60 minutes.   Drue Second, MD Medical/Oncology [facility] [phone] (beeper) [phone] (Office)  06/26/2012, 11:44 AM

## 2012-06-26 NOTE — Patient Instructions (Addendum)
Proceed with lumpectomy and SLN  Oncotype Dx  Radiation therapy  Anti-estrogen (pill)

## 2012-06-26 NOTE — Progress Notes (Signed)
Checked in new pt with no financial concerns. °

## 2012-06-27 ENCOUNTER — Encounter: Payer: Self-pay | Admitting: *Deleted

## 2012-06-27 NOTE — Progress Notes (Signed)
[facility] Psychosocial Distress Screening Clinical Social Work  Patient completed distress screening protocol, and scored a 0 on the Psychosocial Distress Thermometer which indicates no distress. Clinical Social Worker met with patient in [facility] to assess for distress and other psychosocial needs. Pt stated she felt comfortable after speaking with the physicians and getting more information on her treatment plan.  Pt stated she was not planning to share her diagnosis with any of her family or friends, due to her husband current health condition and additional stress that it would cause her family.  CSW and pt discussed the importance of emotional support throughout her cancer journey.  CSW informed pt of the support team and support services at [facility].  Pt was agreeable to a reach to recovery referral, and expressed interest in the breast cancer support group.  CSW encouraged pt and family to call with any questions or concerns.    Tamala Julian, MSW, LCSW Clinical Social Worker [facility] [phone]

## 2012-07-01 ENCOUNTER — Encounter: Payer: Self-pay | Admitting: *Deleted

## 2012-07-01 ENCOUNTER — Telehealth: Payer: Self-pay | Admitting: *Deleted

## 2012-07-01 NOTE — Telephone Encounter (Signed)
Called patient to f/u from [facility] on 06/26/12.  Left message for patient to return call.

## 2012-07-02 ENCOUNTER — Telehealth (INDEPENDENT_AMBULATORY_CARE_PROVIDER_SITE_OTHER): Payer: Self-pay

## 2012-07-02 ENCOUNTER — Encounter: Payer: Self-pay | Admitting: *Deleted

## 2012-07-02 ENCOUNTER — Other Ambulatory Visit (INDEPENDENT_AMBULATORY_CARE_PROVIDER_SITE_OTHER): Payer: Self-pay | Admitting: Surgery

## 2012-07-02 ENCOUNTER — Telehealth: Payer: Self-pay | Admitting: *Deleted

## 2012-07-02 DIAGNOSIS — C50911 Malignant neoplasm of unspecified site of right female breast: Secondary | ICD-10-CM

## 2012-07-02 NOTE — Telephone Encounter (Signed)
Pt states she is going to reschedule her sx appt ; then we can reschedule her post op app. She states her husband has stage 4 Ca and does not know about her breast Ca.

## 2012-07-03 ENCOUNTER — Telehealth: Payer: Self-pay | Admitting: *Deleted

## 2012-07-03 NOTE — Telephone Encounter (Signed)
Called patient to f/u [facility].  Left voice mail message and contact information.

## 2012-07-03 NOTE — Telephone Encounter (Signed)
Patient returned call in response to message left regarding f/u to [facility] on 06/26/12.  Patient states that she is doing well, "one day at a time".  Patient's husband is receiving Hospice care at home. We discussed patient's scheduled appointments and new surgery date on 07/19/12.  Patient denied any questions.  I encouraged patient to call with any questions, concerns or need to talk.

## 2012-07-04 ENCOUNTER — Telehealth (INDEPENDENT_AMBULATORY_CARE_PROVIDER_SITE_OTHER): Payer: Self-pay

## 2012-07-04 NOTE — Telephone Encounter (Signed)
V/M to call-p/o  Appointment is 08/01/12@3p  with Dr. Ezzard Standing

## 2012-07-06 ENCOUNTER — Telehealth: Payer: Self-pay | Admitting: Oncology

## 2012-07-06 NOTE — Telephone Encounter (Signed)
S/w the pt and she is aware of her f/u appt on 08/13/2012@9 :30am

## 2012-07-08 ENCOUNTER — Telehealth: Payer: Self-pay | Admitting: *Deleted

## 2012-07-08 ENCOUNTER — Telehealth (INDEPENDENT_AMBULATORY_CARE_PROVIDER_SITE_OTHER): Payer: Self-pay

## 2012-07-08 NOTE — Telephone Encounter (Signed)
Confirmed appt 08/01/12 @ 3PM with Dr. Ezzard Standing

## 2012-07-08 NOTE — Telephone Encounter (Signed)
Spoke with patient via phone to confirm that patient received my previous message about appointment scheduled with Dr. Welton Flakes on 4/22.  Patient reported that the appointment date and time are acceptable.  We also reviewed other scheduled appointments.  Patient reported that her husband is home for terminal care and they are hoping he will make it until her son and new grandbaby arrive from [place] on Sunday.  Patient says she is doing OK and denies any questions or concerns.  Patient able to verbalize upcoming appointments.  Patient encouraged to call for questions or concerns.

## 2012-07-09 ENCOUNTER — Encounter (HOSPITAL_COMMUNITY): Payer: 59

## 2012-07-12 ENCOUNTER — Encounter: Payer: Self-pay | Admitting: Oncology

## 2012-07-16 NOTE — Pre-Procedure Instructions (Signed)
Do not need to repeat K+ prior to surgery, per Dr. Ivin Booty.

## 2012-07-17 ENCOUNTER — Ambulatory Visit (INDEPENDENT_AMBULATORY_CARE_PROVIDER_SITE_OTHER): Payer: 59 | Admitting: General Surgery

## 2012-07-19 ENCOUNTER — Encounter (HOSPITAL_COMMUNITY): Payer: 59

## 2012-07-22 ENCOUNTER — Encounter: Payer: Self-pay | Admitting: *Deleted

## 2012-07-23 ENCOUNTER — Telehealth (INDEPENDENT_AMBULATORY_CARE_PROVIDER_SITE_OTHER): Payer: Self-pay

## 2012-07-23 ENCOUNTER — Telehealth: Payer: Self-pay | Admitting: Oncology

## 2012-07-23 NOTE — Telephone Encounter (Signed)
Per 3/31 pof from [facility]) r/s 4/22 appt to the following wk. Per Dawn appt scheduled for 5/2 @ 1pm. lmonvm for pt re change and new d/t for 5/2. Schedule mailed.

## 2012-07-23 NOTE — Telephone Encounter (Signed)
V/M  P/O appointment scheduled 08/14/12 12:15

## 2012-07-26 NOTE — Progress Notes (Signed)
Pt called again-husband in grave health at home with hospice-she just cannot do her surgery-to call dr Butch Penny office to r/s-

## 2012-07-29 ENCOUNTER — Telehealth (INDEPENDENT_AMBULATORY_CARE_PROVIDER_SITE_OTHER): Payer: Self-pay | Admitting: *Deleted

## 2012-07-29 ENCOUNTER — Telehealth: Payer: Self-pay | Admitting: *Deleted

## 2012-07-29 NOTE — Telephone Encounter (Signed)
Courtesy call to express our sympathy. Advised patient to call when she was ready to reschedule surgery.

## 2012-07-29 NOTE — Telephone Encounter (Signed)
Dr. Ezzard Standing will call and speak with patient at this time.

## 2012-07-29 NOTE — Telephone Encounter (Signed)
[facility] called to inform us that patient is requesting to cancel her surgery tomorrow due to having to care for her husband in hospice.  Ezzard Standing MD made aware prior to cancelling.

## 2012-07-29 NOTE — Telephone Encounter (Signed)
Patient's appointment schedule showed that patient's surgery is cancelled for 07/30/12 due to husband's terminal illness and hospice care.  I called to check in with patient and left a voice mail message with contact information.

## 2012-07-30 ENCOUNTER — Encounter (HOSPITAL_BASED_OUTPATIENT_CLINIC_OR_DEPARTMENT_OTHER): Admission: RE | Payer: Self-pay | Source: Ambulatory Visit

## 2012-07-30 ENCOUNTER — Encounter (HOSPITAL_COMMUNITY): Payer: 59

## 2012-07-30 ENCOUNTER — Ambulatory Visit (HOSPITAL_BASED_OUTPATIENT_CLINIC_OR_DEPARTMENT_OTHER): Admission: RE | Admit: 2012-07-30 | Payer: 59 | Source: Ambulatory Visit | Admitting: Surgery

## 2012-07-30 SURGERY — BREAST LUMPECTOMY WITH NEEDLE LOCALIZATION AND AXILLARY SENTINEL LYMPH NODE BX
Anesthesia: General | Site: Breast | Laterality: Right

## 2012-07-31 ENCOUNTER — Encounter: Payer: Self-pay | Admitting: Oncology

## 2012-08-01 ENCOUNTER — Encounter (INDEPENDENT_AMBULATORY_CARE_PROVIDER_SITE_OTHER): Payer: Managed Care, Other (non HMO) | Admitting: Surgery

## 2012-08-05 ENCOUNTER — Encounter: Payer: Self-pay | Admitting: *Deleted

## 2012-08-05 NOTE — Progress Notes (Signed)
I called patient to check in and f/u with rescheduling surgery.  Patient reports that her husband died 2012-08-09.  She says that she is doing OK.  Her son and grandson are currently with her.  She reports that she is ready to schedule surgery and asked me to notify Dr. Allene Pyo office.  I have called his office and left a message and contact information.  She denies any other questions or needs at this time.  I encouraged her to call me for any questions or concerns.

## 2012-08-08 ENCOUNTER — Encounter: Payer: Self-pay | Admitting: *Deleted

## 2012-08-08 ENCOUNTER — Telehealth (INDEPENDENT_AMBULATORY_CARE_PROVIDER_SITE_OTHER): Payer: Self-pay

## 2012-08-08 NOTE — Telephone Encounter (Signed)
Follow up call informed patient of her up coming surgery  scheduled for 08/20/12 which is aware of. She states she does not  Have any questions at this time but will call if she does.

## 2012-08-13 ENCOUNTER — Ambulatory Visit: Payer: 59 | Admitting: Oncology

## 2012-08-14 ENCOUNTER — Encounter (HOSPITAL_COMMUNITY)
Admission: RE | Admit: 2012-08-14 | Discharge: 2012-08-14 | Disposition: A | Discharge status: Home / Self Care | Payer: 59 | Source: Ambulatory Visit | Attending: Anesthesiology | Admitting: Anesthesiology

## 2012-08-14 ENCOUNTER — Encounter (HOSPITAL_COMMUNITY)
Admission: RE | Admit: 2012-08-14 | Discharge: 2012-08-14 | Disposition: A | Discharge status: Home / Self Care | Payer: 59 | Source: Ambulatory Visit | Attending: Surgery | Admitting: Surgery

## 2012-08-14 ENCOUNTER — Encounter (HOSPITAL_COMMUNITY): Payer: Self-pay

## 2012-08-14 ENCOUNTER — Encounter (INDEPENDENT_AMBULATORY_CARE_PROVIDER_SITE_OTHER): Payer: Managed Care, Other (non HMO) | Admitting: Surgery

## 2012-08-14 HISTORY — DX: Gastro-esophageal reflux disease without esophagitis: K21.9

## 2012-08-14 LAB — COMPREHENSIVE METABOLIC PANEL
ALT: 15 U/L (ref 0–35)
AST: 22 U/L (ref 0–37)
Albumin: 4.3 g/dL (ref 3.5–5.2)
Alkaline Phosphatase: 81 U/L (ref 39–117)
BUN: 17 mg/dL (ref 6–23)
CO2: 28 mEq/L (ref 19–32)
Calcium: 10.3 mg/dL (ref 8.4–10.5)
Chloride: 101 mEq/L (ref 96–112)
Creatinine, Ser: 1.01 mg/dL (ref 0.50–1.10)
GFR calc Af Amer: 69 mL/min — ABNORMAL LOW (ref >90)
GFR calc non Af Amer: 60 mL/min — ABNORMAL LOW (ref >90)
Glucose, Bld: 93 mg/dL (ref 70–99)
Potassium: 3.4 mEq/L — ABNORMAL LOW (ref 3.5–5.1)
Sodium: 140 mEq/L (ref 135–145)
Total Bilirubin: 0.3 mg/dL (ref 0.3–1.2)
Total Protein: 8.3 g/dL (ref 6.0–8.3)

## 2012-08-14 LAB — CBC
HCT: 38.6 % (ref 36.0–46.0)
Hemoglobin: 13.2 g/dL (ref 12.0–15.0)
MCH: 29.1 pg (ref 26.0–34.0)
MCHC: 34.2 g/dL (ref 30.0–36.0)
MCV: 85.2 fL (ref 78.0–100.0)
Platelets: 192 10*3/uL (ref 150–400)
RBC: 4.53 MIL/uL (ref 3.87–5.11)
RDW: 13.4 % (ref 11.5–15.5)
WBC: 7.8 10*3/uL (ref 4.0–10.5)

## 2012-08-14 LAB — SURGICAL PCR SCREEN
MRSA, PCR: NEGATIVE
Staphylococcus aureus: NEGATIVE

## 2012-08-14 NOTE — Progress Notes (Signed)
Primary Physician - Dr. Nicholos Johns Does not have a cardiologist No previous cardiac testing

## 2012-08-14 NOTE — Pre-Procedure Instructions (Signed)
Sharon Nguyen  08/14/2012   Your procedure is scheduled on:  Tuesday, April 29th  Report to [facility] at 0945 AM or once finished at breast center for needle localization.  Call this number if you have problems the morning of surgery: [phone]   Remember:   Do not eat food or drink liquids after midnight.    Take these medicines the morning of surgery with A SIP OF WATER: Norvasc   Do not wear jewelry, make-up or nail polish.  Do not wear lotions, powders, or perfume,deodorant.  Do not shave 48 hours prior to surgery.   Do not bring valuables to the hospital.  Contacts, dentures or bridgework may not be worn into surgery.  Leave suitcase in the car. After surgery it may be brought to your room.  For patients admitted to the hospital, checkout time is 11:00 AM the day of discharge.   Patients discharged the day of surgery will not be allowed to drive home.    Special Instructions: Shower using CHG 2 nights before surgery and the night before surgery.  If you shower the day of surgery use CHG.  Use special wash - you have one bottle of CHG for all showers.  You should use approximately 1/3 of the bottle for each shower.   Please read over the following fact sheets that you were given: Pain Booklet, Coughing and Deep Breathing, MRSA Information and Surgical Site Infection Prevention

## 2012-08-15 ENCOUNTER — Ambulatory Visit: Payer: 59

## 2012-08-15 ENCOUNTER — Ambulatory Visit: Payer: 59 | Admitting: Radiation Oncology

## 2012-08-15 NOTE — Progress Notes (Signed)
Anesthesia chart review: Patient is a 57 year old female scheduled for right breast lumpectomy (needle localization) and right axillary SN biopsy on 08/20/2012 by Dr. Ezzard Standing.  History includes breast cancer, former smoker, hypertension, hepatitis C treated at [facility] May 2011 (per Dr. Allene Pyo 06/26/12 office visit, "she says she is disease free", GERD, TB s/p treatment 2006/2007, mandibular fracture surgery, thyroid cyst excision, tubal ligation. PCP is Dr. Nicholos Johns.  Preoperative labs noted. AST/ALT and PLT count WNL.  CXR on 08/14/12 showed question mild emphysematous changes with biapical scarring. No acute abnormalities.   EKG on 08/14/12 showed SR with occasional PVC, incomplete right BBB.  She will be evaluated by her assigned anesthesiologist on the day of surgery, but if no acute changes then would anticipate that she could proceed as planned.  Velna Ochs [facility]/Anesthesiology Phone [phone] 08/15/2012 11:14 AM

## 2012-08-16 ENCOUNTER — Encounter: Payer: Self-pay | Admitting: *Deleted

## 2012-08-16 NOTE — Progress Notes (Signed)
Mailed after appt letter to pt. 

## 2012-08-19 ENCOUNTER — Other Ambulatory Visit: Payer: Self-pay | Admitting: *Deleted

## 2012-08-19 ENCOUNTER — Telehealth: Payer: Self-pay | Admitting: *Deleted

## 2012-08-19 DIAGNOSIS — C50919 Malignant neoplasm of unspecified site of unspecified female breast: Secondary | ICD-10-CM

## 2012-08-19 MED ORDER — CEFAZOLIN SODIUM-DEXTROSE 2-3 GM-% IV SOLR
2.0000 g | INTRAVENOUS | Status: AC
  Administered 2012-08-20: 2 g via INTRAVENOUS
  Filled 2012-08-19: qty 50

## 2012-08-19 NOTE — Telephone Encounter (Signed)
I called patient to check in prior to surgery tomorrow to see if she has any questions or concerns.  I left a voicemail message with her appointment schedule for Dr. Welton Flakes and Dr. Dayton Scrape which were rescheduled due to her surgery being rescheduled.  Contact information left with instructions to call for any questions, concerns or needs.

## 2012-08-20 ENCOUNTER — Ambulatory Visit (HOSPITAL_COMMUNITY): Payer: 59 | Admitting: Anesthesiology

## 2012-08-20 ENCOUNTER — Ambulatory Visit
Admission: RE | Admit: 2012-08-20 | Discharge: 2012-08-20 | Disposition: A | Discharge status: Home / Self Care | Payer: 59 | Source: Ambulatory Visit | Attending: Surgery | Admitting: Surgery

## 2012-08-20 ENCOUNTER — Encounter (HOSPITAL_COMMUNITY)
Admission: RE | Disposition: A | Discharge status: Home / Self Care | Payer: Self-pay | Source: Ambulatory Visit | Attending: Surgery

## 2012-08-20 ENCOUNTER — Encounter (HOSPITAL_COMMUNITY)
Admission: RE | Admit: 2012-08-20 | Discharge: 2012-08-20 | Disposition: A | Discharge status: Home / Self Care | Payer: 59 | Source: Ambulatory Visit | Attending: Surgery | Admitting: Surgery

## 2012-08-20 ENCOUNTER — Encounter (HOSPITAL_COMMUNITY): Payer: Self-pay | Admitting: *Deleted

## 2012-08-20 ENCOUNTER — Ambulatory Visit (HOSPITAL_COMMUNITY)
Admission: RE | Admit: 2012-08-20 | Discharge: 2012-08-20 | Disposition: A | Discharge status: Home / Self Care | Payer: 59 | Source: Ambulatory Visit | Attending: Surgery | Admitting: Surgery

## 2012-08-20 ENCOUNTER — Encounter (HOSPITAL_COMMUNITY): Payer: Self-pay | Admitting: Vascular Surgery

## 2012-08-20 ENCOUNTER — Encounter: Payer: Self-pay | Admitting: *Deleted

## 2012-08-20 ENCOUNTER — Telehealth: Payer: Self-pay | Admitting: Oncology

## 2012-08-20 DIAGNOSIS — Z8611 Personal history of tuberculosis: Secondary | ICD-10-CM | POA: Insufficient documentation

## 2012-08-20 DIAGNOSIS — I10 Essential (primary) hypertension: Secondary | ICD-10-CM | POA: Insufficient documentation

## 2012-08-20 DIAGNOSIS — Z8619 Personal history of other infectious and parasitic diseases: Secondary | ICD-10-CM | POA: Insufficient documentation

## 2012-08-20 DIAGNOSIS — C50511 Malignant neoplasm of lower-outer quadrant of right female breast: Secondary | ICD-10-CM

## 2012-08-20 DIAGNOSIS — Z171 Estrogen receptor negative status [ER-]: Secondary | ICD-10-CM | POA: Insufficient documentation

## 2012-08-20 DIAGNOSIS — C50519 Malignant neoplasm of lower-outer quadrant of unspecified female breast: Secondary | ICD-10-CM | POA: Insufficient documentation

## 2012-08-20 DIAGNOSIS — C50919 Malignant neoplasm of unspecified site of unspecified female breast: Secondary | ICD-10-CM

## 2012-08-20 DIAGNOSIS — C50911 Malignant neoplasm of unspecified site of right female breast: Secondary | ICD-10-CM

## 2012-08-20 DIAGNOSIS — Z87891 Personal history of nicotine dependence: Secondary | ICD-10-CM | POA: Insufficient documentation

## 2012-08-20 HISTORY — PX: BREAST LUMPECTOMY WITH NEEDLE LOCALIZATION AND AXILLARY SENTINEL LYMPH NODE BX: SHX5760

## 2012-08-20 SURGERY — BREAST LUMPECTOMY WITH NEEDLE LOCALIZATION AND AXILLARY SENTINEL LYMPH NODE BX
Anesthesia: General | Site: Breast | Laterality: Right | Wound class: Clean

## 2012-08-20 MED ORDER — ONDANSETRON HCL 4 MG/2ML IJ SOLN: 4.0000 mg | Freq: Once | INTRAMUSCULAR | Status: DC | PRN

## 2012-08-20 MED ORDER — ROCURONIUM BROMIDE 100 MG/10ML IV SOLN
INTRAVENOUS | Status: DC | PRN
  Administered 2012-08-20: 10 mg via INTRAVENOUS
  Administered 2012-08-20: 30 mg via INTRAVENOUS

## 2012-08-20 MED ORDER — FENTANYL CITRATE 0.05 MG/ML IJ SOLN
INTRAMUSCULAR | Status: DC | PRN
  Administered 2012-08-20 (×3): 50 ug via INTRAVENOUS

## 2012-08-20 MED ORDER — MEPERIDINE HCL 25 MG/ML IJ SOLN: 6.2500 mg | INTRAMUSCULAR | Status: DC | PRN

## 2012-08-20 MED ORDER — NEOSTIGMINE METHYLSULFATE 1 MG/ML IJ SOLN
INTRAMUSCULAR | Status: DC | PRN
  Administered 2012-08-20: 3 mg via INTRAVENOUS

## 2012-08-20 MED ORDER — BUPIVACAINE HCL (PF) 0.25 % IJ SOLN
INTRAMUSCULAR | Status: DC | PRN
  Administered 2012-08-20: 30 mL

## 2012-08-20 MED ORDER — MIDAZOLAM HCL 2 MG/2ML IJ SOLN
INTRAMUSCULAR | Status: AC
  Administered 2012-08-20: 2 mg
  Filled 2012-08-20: qty 2

## 2012-08-20 MED ORDER — SUCCINYLCHOLINE CHLORIDE 20 MG/ML IJ SOLN
INTRAMUSCULAR | Status: DC | PRN
  Administered 2012-08-20: 100 mg via INTRAVENOUS

## 2012-08-20 MED ORDER — FENTANYL CITRATE 0.05 MG/ML IJ SOLN
100.0000 ug | Freq: Once | INTRAMUSCULAR | Status: AC
  Administered 2012-08-20: 100 ug via INTRAVENOUS

## 2012-08-20 MED ORDER — METHYLENE BLUE 1 % INJ SOLN
INTRAMUSCULAR | Status: AC
  Filled 2012-08-20: qty 10

## 2012-08-20 MED ORDER — ONDANSETRON HCL 4 MG/2ML IJ SOLN
INTRAMUSCULAR | Status: DC | PRN
  Administered 2012-08-20: 4 mg via INTRAVENOUS

## 2012-08-20 MED ORDER — OXYCODONE HCL 5 MG PO TABS
5.0000 mg | ORAL_TABLET | Freq: Once | ORAL | Status: AC | PRN
Start: 2012-08-20 — End: 2012-08-20

## 2012-08-20 MED ORDER — LACTATED RINGERS IV SOLN
INTRAVENOUS | Status: DC | PRN
  Administered 2012-08-20 (×2): via INTRAVENOUS

## 2012-08-20 MED ORDER — FENTANYL CITRATE 0.05 MG/ML IJ SOLN
INTRAMUSCULAR | Status: AC
  Filled 2012-08-20: qty 2

## 2012-08-20 MED ORDER — GLYCOPYRROLATE 0.2 MG/ML IJ SOLN
INTRAMUSCULAR | Status: DC | PRN
  Administered 2012-08-20: 0.4 mg via INTRAVENOUS

## 2012-08-20 MED ORDER — CHLORHEXIDINE GLUCONATE 4 % EX LIQD: 1.0000 "application " | Freq: Once | CUTANEOUS | Status: DC

## 2012-08-20 MED ORDER — LIDOCAINE HCL (CARDIAC) 20 MG/ML IV SOLN
INTRAVENOUS | Status: DC | PRN
  Administered 2012-08-20: 100 mg via INTRAVENOUS

## 2012-08-20 MED ORDER — HYDROMORPHONE HCL PF 1 MG/ML IJ SOLN
INTRAMUSCULAR | Status: AC
  Administered 2012-08-20: 0.5 mg via INTRAVENOUS
  Filled 2012-08-20: qty 1

## 2012-08-20 MED ORDER — HYDROCODONE-ACETAMINOPHEN 5-325 MG PO TABS: 1.0000 | ORAL_TABLET | Freq: Four times a day (QID) | ORAL | Status: DC | PRN

## 2012-08-20 MED ORDER — LACTATED RINGERS IV SOLN
INTRAVENOUS | Status: DC
  Administered 2012-08-20: 11:00:00 via INTRAVENOUS

## 2012-08-20 MED ORDER — OXYCODONE HCL 5 MG PO TABS
ORAL_TABLET | ORAL | Status: AC
  Administered 2012-08-20: 5 mg via ORAL
  Filled 2012-08-20: qty 1

## 2012-08-20 MED ORDER — OXYCODONE HCL 5 MG/5ML PO SOLN: 5.0000 mg | Freq: Once | ORAL | Status: AC | PRN

## 2012-08-20 MED ORDER — SODIUM CHLORIDE 0.9 % IJ SOLN
INTRAMUSCULAR | Status: DC | PRN
  Administered 2012-08-20: 13:00:00 via SUBCUTANEOUS

## 2012-08-20 MED ORDER — EPHEDRINE SULFATE 50 MG/ML IJ SOLN
INTRAMUSCULAR | Status: DC | PRN
  Administered 2012-08-20: 5 mg via INTRAVENOUS
  Administered 2012-08-20: 10 mg via INTRAVENOUS

## 2012-08-20 MED ORDER — TECHNETIUM TC 99M SULFUR COLLOID FILTERED
1.0000 | Freq: Once | INTRAVENOUS | Status: AC | PRN
  Administered 2012-08-20: 1 via INTRADERMAL

## 2012-08-20 MED ORDER — PROPOFOL 10 MG/ML IV BOLUS
INTRAVENOUS | Status: DC | PRN
  Administered 2012-08-20: 200 mg via INTRAVENOUS

## 2012-08-20 MED ORDER — BUPIVACAINE HCL (PF) 0.25 % IJ SOLN
INTRAMUSCULAR | Status: AC
  Filled 2012-08-20: qty 30

## 2012-08-20 MED ORDER — HYDROMORPHONE HCL PF 1 MG/ML IJ SOLN
0.2500 mg | INTRAMUSCULAR | Status: DC | PRN
  Administered 2012-08-20: 0.5 mg via INTRAVENOUS

## 2012-08-20 MED ORDER — ARTIFICIAL TEARS OP OINT
TOPICAL_OINTMENT | OPHTHALMIC | Status: DC | PRN
  Administered 2012-08-20: 1 via OPHTHALMIC

## 2012-08-20 SURGICAL SUPPLY — 51 items
ADH SKN CLS APL DERMABOND .7 (GAUZE/BANDAGES/DRESSINGS) ×1
APPLIER CLIP 9.375 MED OPEN (MISCELLANEOUS) ×2
APPLIER CLIP 9.375 SM OPEN (CLIP) ×2
APR CLP MED 9.3 20 MLT OPN (MISCELLANEOUS) ×1
APR CLP SM 9.3 20 MLT OPN (CLIP) ×1
BINDER BREAST LRG (GAUZE/BANDAGES/DRESSINGS) ×1 IMPLANT
BINDER BREAST XLRG (GAUZE/BANDAGES/DRESSINGS) IMPLANT
CANISTER SUCTION 2500CC (MISCELLANEOUS) ×2 IMPLANT
CHLORAPREP W/TINT 26ML (MISCELLANEOUS) ×2 IMPLANT
CLIP APPLIE 9.375 MED OPEN (MISCELLANEOUS) ×1 IMPLANT
CLIP APPLIE 9.375 SM OPEN (CLIP) IMPLANT
CLIP TI WIDE RED SMALL 6 (CLIP) ×2 IMPLANT
CLOTH BEACON ORANGE TIMEOUT ST (SAFETY) ×2 IMPLANT
CONT SPEC 4OZ CLIKSEAL STRL BL (MISCELLANEOUS) ×2 IMPLANT
COVER PROBE W GEL 5X96 (DRAPES) ×2 IMPLANT
COVER SURGICAL LIGHT HANDLE (MISCELLANEOUS) ×2 IMPLANT
DERMABOND ADVANCED (GAUZE/BANDAGES/DRESSINGS) ×1
DERMABOND ADVANCED .7 DNX12 (GAUZE/BANDAGES/DRESSINGS) ×1 IMPLANT
DEVICE DUBIN SPECIMEN MAMMOGRA (MISCELLANEOUS) ×2 IMPLANT
DRAPE CHEST BREAST 15X10 FENES (DRAPES) ×2 IMPLANT
DRAPE PROXIMA HALF (DRAPES) ×2 IMPLANT
DRAPE UTILITY 15X26 W/TAPE STR (DRAPE) ×4 IMPLANT
ELECT COATED BLADE 2.86 ST (ELECTRODE) ×2 IMPLANT
ELECT REM PT RETURN 9FT ADLT (ELECTROSURGICAL) ×2
ELECTRODE REM PT RTRN 9FT ADLT (ELECTROSURGICAL) ×1 IMPLANT
GLOVE BIO SURGEON STRL SZ 6.5 (GLOVE) ×2 IMPLANT
GLOVE BIOGEL PI IND STRL 6.5 (GLOVE) IMPLANT
GLOVE BIOGEL PI IND STRL 7.0 (GLOVE) IMPLANT
GLOVE BIOGEL PI INDICATOR 6.5 (GLOVE) ×1
GLOVE BIOGEL PI INDICATOR 7.0 (GLOVE) ×2
GLOVE SURG SIGNA 7.5 PF LTX (GLOVE) ×2 IMPLANT
GOWN PREVENTION PLUS XLARGE (GOWN DISPOSABLE) ×2 IMPLANT
GOWN STRL NON-REIN LRG LVL3 (GOWN DISPOSABLE) ×4 IMPLANT
KIT BASIN OR (CUSTOM PROCEDURE TRAY) ×2 IMPLANT
KIT MARKER MARGIN INK (KITS) ×2 IMPLANT
KIT ROOM TURNOVER OR (KITS) ×2 IMPLANT
NDL 18GX1X1/2 (RX/OR ONLY) (NEEDLE) ×1 IMPLANT
NDL HYPO 25GX1X1/2 BEV (NEEDLE) ×2 IMPLANT
NEEDLE 18GX1X1/2 (RX/OR ONLY) (NEEDLE) ×2 IMPLANT
NEEDLE HYPO 25GX1X1/2 BEV (NEEDLE) ×4 IMPLANT
NS IRRIG 1000ML POUR BTL (IV SOLUTION) ×2 IMPLANT
PACK GENERAL/GYN (CUSTOM PROCEDURE TRAY) ×2 IMPLANT
PAD ARMBOARD 7.5X6 YLW CONV (MISCELLANEOUS) ×2 IMPLANT
SPONGE GAUZE 4X4 12PLY (GAUZE/BANDAGES/DRESSINGS) ×1 IMPLANT
STAPLER VISISTAT 35W (STAPLE) IMPLANT
SUT MON AB 5-0 PS2 18 (SUTURE) ×2 IMPLANT
SUT VIC AB 3-0 SH 18 (SUTURE) ×2 IMPLANT
SYR CONTROL 10ML LL (SYRINGE) ×4 IMPLANT
TAPE CLOTH SURG 6X10 WHT LF (GAUZE/BANDAGES/DRESSINGS) ×1 IMPLANT
TOWEL OR 17X24 6PK STRL BLUE (TOWEL DISPOSABLE) ×2 IMPLANT
TOWEL OR 17X26 10 PK STRL BLUE (TOWEL DISPOSABLE) ×2 IMPLANT

## 2012-08-20 NOTE — Anesthesia Preprocedure Evaluation (Addendum)
Anesthesia Evaluation  Patient identified by MRN, date of birth, ID band Patient awake    Reviewed: Allergy & Precautions, H&P , NPO status , Patient's Chart, lab work & pertinent test results, reviewed documented beta blocker date and time   Airway Mallampati: II TM Distance: >3 FB     Dental  (+) Teeth Intact   Pulmonary former smoker,    Pulmonary exam normal       Cardiovascular hypertension, Pt. on medications     Neuro/Psych    GI/Hepatic GERD-  ,(+) Hepatitis -, C  Endo/Other    Renal/GU      Musculoskeletal   Abdominal Normal abdominal exam  (+)   Peds  Hematology   Anesthesia Other Findings   Reproductive/Obstetrics                          Anesthesia Physical Anesthesia Plan  ASA: III  Anesthesia Plan: General   Post-op Pain Management:    Induction: Intravenous  Airway Management Planned: Oral ETT  Additional Equipment:   Intra-op Plan:   Post-operative Plan: Extubation in OR  Informed Consent: I have reviewed the patients History and Physical, chart, labs and discussed the procedure including the risks, benefits and alternatives for the proposed anesthesia with the patient or authorized representative who has indicated his/her understanding and acceptance.   Dental advisory given  Plan Discussed with: CRNA and Surgeon  Anesthesia Plan Comments:        Anesthesia Quick Evaluation

## 2012-08-20 NOTE — Transfer of Care (Signed)
Immediate Anesthesia Transfer of Care Note  Patient: Sharon Nguyen  Procedure(s) Performed: Procedure(s): BREAST LUMPECTOMY WITH NEEDLE LOCALIZATION AND AXILLARY SENTINEL LYMPH NODE BX (Right)  Patient Location: PACU  Anesthesia Type:General  Level of Consciousness: awake, alert  and oriented  Airway & Oxygen Therapy: Patient Spontanous Breathing and Patient connected to face mask oxygen  Post-op Assessment: Report given to PACU RN  Post vital signs: Reviewed and stable  Complications: No apparent anesthesia complications

## 2012-08-20 NOTE — Progress Notes (Signed)
Radiology in to do sentinal node injection.  Pt. Tolerated procedure well, vital signs stable.

## 2012-08-20 NOTE — Telephone Encounter (Signed)
Per 4/28 pof scheduled pt for KK 5/23 @ 2pm w/lb at 1:30pm. lmonvm for pt re appt for 5/23. Also left 2nd message informing pt that 5/2 appt is cx'd. Per 4/17 pof cx 5/2 appt - scheduling was waiting on new d/t. Schedule mailed.

## 2012-08-20 NOTE — Progress Notes (Signed)
Mailed after appt letter to pt. 

## 2012-08-20 NOTE — Preoperative (Signed)
Beta Blockers   Reason not to administer Beta Blockers:Not Applicable 

## 2012-08-20 NOTE — Op Note (Signed)
08/20/2012  1:25 PM  PATIENT:  Sharon Nguyen DOB: [birth year] MRN: [id number]  PREOP DIAGNOSIS:  Right breast cancer  POSTOP DIAGNOSIS:   Right breast Cancer  , 8 o'clock position (T1, N0)  PROCEDURE:   Procedure(s): Right BREAST LUMPECTOMY WITH NEEDLE LOCALIZATION AND Right AXILLARY SENTINEL LYMPH NODE BX, Injection of peri areolar area of breast with methylene blue (0.5 cc)  SURGEON:   Ovidio Kin, M.D.  ANESTHESIA:   general  Anesthesiologist: Aubery Lapping, MD CRNA: Edmonia Caprio, CRNA; Kristopher Key, CRNA  General  EBL:  minimal  ml  DRAINS: none   LOCAL MEDICATIONS USED:   30 cc 1/4% marcaine  SPECIMEN:   Right breast lumpectomy and right axillary sentinel lymph node  COUNTS CORRECT:  YES  INDICATIONS FOR PROCEDURE:  Sharon Nguyen is a 57 y.o. (DOB: 06/19/1955) AA  female whose primary care physician is Lolita Patella, MD and comes for right breast lumpectomy and right axillary sentinel lymph node biopsy.   Options for breast cancer treatment were discussed with the patient. She elected to proceed with lumpectomy and axillary sentinel lymph node.     The indications and potential complications of surgery were explained to the patient. Potential complications include, but are not limited to, bleeding, infection, the need for further surgery, and nerve injury.     The patient had a needle loc wire placed at the  8 o'clock position of the right breast (near the inframammary fold) at [facility] by Dr. Lyman Bishop.  In the holding area, her right areola was injected with 1 millicurie of Technitium Sulfur Colloid.  OPERATIVE NOTE:  The patient was taken to room # 12 at [facility] where she underwent a general anesthesia  supervised by Anesthesiologist: Aubery Lapping, MD CRNA: Edmonia Caprio, CRNA; Theodosia Quay, CRNA. Her right breast and axilla were prepped with  ChloraPrep and sterilely draped.   A time-out and the surgical check list was reviewed.   I  injected about 0.5 mL of 40% methylene blue around her right areola.  I started with the right axillary sentinel lymph node biopsy. I found a hot area at the junction of the breast and the pectoralis major muscle. I cut down and  identified a hot node that had counts of 220 and the background has 30 counts. The lymph node was blue. I checked her internal mammary nodes and supraclavicular nodes with the neoprobe and found no other hot area. The axillary node was then sent to pathology.   I turned attention to the cancer which was about at the 8 o'clock position of the right breast. I cut down around the wire and tried to take an ellipse of breast tissue around the tumor by at least 1 cm.   I excised this block of breast tissue approximately 5 cm by 6 cm  in diameter. I did take the dissection down to the pectoralis major. I painted this specimen with the 6 color paint kit and did a specimen mammogram which confirmed the mass, clip and the wire were all in the right position.   I then irrigated the wound with saline. I infiltrated approximately 30 mL of 1% local between the  Incisions.  I placed 6 clips to mark the breast biopsy cavity, at 12, 3, 6, and 9 o'clock. Two clips were placed on the pectoralis major.   I then closed all the wounds in layers using 3-0 Vicryl sutures for the deep layer. At the skin, I  closed the incisions with a 5-0 Monocryl suture. The incisions were then painted with Dermabond.  She had gauze place over the wounds and placed in a breast binder.  The patient tolerated the procedure well, was transported to the recovery room in good condition. Sponge and needle count were correct at the end of the case.   Final pathology is pending.   Ovidio Kin, MD, [facility] Pager: [phone] Office phone:  [phone]

## 2012-08-20 NOTE — H&P (Signed)
Re: Sharon Nguyen  DOB: [birth year]  MRN: [id number]   [facility]   ASSESSMENT AND PLAN:  1. Right Breast Cancer   Mammogram - 1.4 cm   MRI - 06/21/2012   ER - , PR - , Her2Neu - neg, K67 - 22%   Oncologists: Drs. Dayton Scrape and [employer].   I discussed the options for breast cancer treatment with the patient. The patient is in the multidisciplinary clinic to discuss a multidisciplinary approach to the treatment of breast cancer, which includes medical oncology and radiation oncology. I discussed the surgical options of lumpectomy vs. mastectomy. She is a candidate for lumpectomy. I discussed the options of lymph node biopsy. The treatment plan depends on the pathologic staging of the tumor and the patient's personal wishes.   The risks of surgery include, but are not limited to, bleeding, infection, the need for further surgery, and nerve injury.  The patient has been given literature on the treatment of breast cancer.   Plan: 1.) Lumpectomy (needle loc) and right axillary sentinel lymph node biopsy, 2.) Oncotype   Patient's surgery delayed because of husband's illness.  He dies 06-Aug-2012.  Dr. Gerome Apley was his oncologist.  She is now ready to go ahead with her breast surgery. Her two sons are here with her today.  2. Hypertension  3. Hepatitis C - treated through [facility] - May 2011   She says she is disease free  4. 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 Husband in hospice for "bile duct cancer"  No chief complaint on file.   REFERRING PHYSICIAN: Lolita Patella, MD   HISTORY OF PRESENT ILLNESS:  Sharon Nguyen is a 57 y.o. (DOB: 1955/06/17) AA female whose primary care physician is Lolita Patella, MD and [facility] right breast cancer.  The patient has had routine mammography. In fact about a year ago, to get her mammograms more frequently. She had a mammogram on 04/30/2012 showed an ill-defined nodule in the right lower outer quadrant. She had a biopsy on  06/10/2012 showed an invasive mammary carcinoma. There is no indication for an MRI.  Her last period was around 2003 when she was 57 years old. She is not on hormone medication. She's had no prior history of breast disease or breast biopsy.   Her husband is in hospice for a bile duct cancer. He is doing poorly and this occupies much of her time. Because of her husband's illness, she is not told anyone in her family about her breast cancer.   No past medical history on file.  No past surgical history on file.  No current outpatient prescriptions on file.    No current facility-administered medications for this visit.   Allergies no known allergies   REVIEW OF SYSTEMS:  Skin: No history of rash.  Infection: History of TB and Hep C. She was treated at [facility] for the Hep C, but does not remember the physician's name.  Neurologic: No history of stroke. No history of seizure. No history of headaches.  Cardiac: Hypertension x 20 years. No history of heart disease. No history of prior cardiac catheterization. No history of seeing a cardiologist.  Pulmonary: Does not smoke cigarettes. No asthma or bronchitis. No OSA/CPAP.  Endocrine: No diabetes. No thyroid disease.  Gastrointestinal: No history of stomach disease. No history of liver disease. No history of gall bladder disease. No history of pancreas disease. Colonoscopy in 2012 - she can't remember the physician.  Urologic: No history of kidney stones. No history of bladder infections.  Musculoskeletal: No history of joint or back disease.  Hematologic: No bleeding disorder. No history of anemia. Not anticoagulated.  Psycho-social: The patient is oriented. The patient has no obvious psychologic or social impairment to understanding our conversation and plan. For what's going on with her husband, she is pretty calm.   SOCIAL and FAMILY HISTORY:  Husband in hospice - has "bile duct cancer".  She has 4 sons, 2 in [place], 1 in [place], and 1 in  [place]. Her children do not know about her diagnosis.  She works as a Engineer, civil (consulting) at [employer].   PHYSICAL EXAM:  BP 112/67  Pulse 83  Temp(Src) 98 F (36.7 C) (Oral)  Resp 20  SpO2 100%  General: WN thinner AA F who is alert and generally healthy appearing.  HEENT: Normal. Pupils equal.  Neck: Supple. No mass. No thyroid mass. She has a scar over the medial right clavicle, where she had the biopsy that showed TB in 2006.  Lymph Nodes: No supraclavicular, cervical or axillary nodes.  Lungs: Clear to auscultation and symmetric breath sounds.  Heart: RRR. No murmur or rub.  Breasts: Right - Scar from core biopsy at about 8 o'clock, approx 8 cm from the areola. I feel a mass, but it is probably from the biopsy.  Left - No mass. No nipple discharge.  Abdomen: Soft. No mass. No tenderness. No hernia. Normal bowel sounds. Infraumbilical scar from TL  Rectal: Not done.  Extremities: Good strength and ROM in upper and lower extremities.  Neurologic: Grossly intact to motor and sensory function.  Psychiatric: Has normal mood and affect. Behavior is normal.   DATA REVIEWED:  Epic and path reports. Path report to patient.  Ovidio Kin, MD, [facility]  [address] [place]  [place], [place] [address]  Phone: [phone] FAX: [phone]

## 2012-08-21 ENCOUNTER — Encounter (HOSPITAL_COMMUNITY): Payer: Self-pay | Admitting: Surgery

## 2012-08-21 NOTE — Anesthesia Postprocedure Evaluation (Signed)
Anesthesia Post Note  Patient: Sharon Nguyen  Procedure(s) Performed: Procedure(s) (LRB): BREAST LUMPECTOMY WITH NEEDLE LOCALIZATION AND AXILLARY SENTINEL LYMPH NODE BX (Right)  Anesthesia type: general  Patient location: PACU  Post pain: Pain level controlled  Post assessment: Patient's Cardiovascular Status Stable  Last Vitals:  Filed Vitals:   08/20/12 1525  BP: 107/69  Pulse: 82  Temp:   Resp: 16    Post vital signs: Reviewed and stable  Level of consciousness: sedated  Complications: No apparent anesthesia complications

## 2012-08-23 ENCOUNTER — Ambulatory Visit: Payer: 59 | Admitting: Oncology

## 2012-08-26 ENCOUNTER — Encounter: Payer: Self-pay | Admitting: *Deleted

## 2012-08-26 ENCOUNTER — Encounter (INDEPENDENT_AMBULATORY_CARE_PROVIDER_SITE_OTHER): Payer: Self-pay

## 2012-08-26 ENCOUNTER — Telehealth (INDEPENDENT_AMBULATORY_CARE_PROVIDER_SITE_OTHER): Payer: Self-pay

## 2012-08-26 NOTE — Progress Notes (Signed)
Ordered Oncotype Dx testing per Dr. Welton Flakes order.  Sent req. To pathology.

## 2012-08-26 NOTE — Telephone Encounter (Signed)
P/O appt is 08/29/12@ 11:15 . Dr.Newman does not have any other times this week; advised to call if she can not make this time ask for [facility]

## 2012-08-27 ENCOUNTER — Ambulatory Visit: Payer: 59 | Admitting: Radiation Oncology

## 2012-08-27 ENCOUNTER — Ambulatory Visit: Payer: 59

## 2012-08-28 ENCOUNTER — Telehealth: Payer: Self-pay | Admitting: Oncology

## 2012-08-29 ENCOUNTER — Encounter (INDEPENDENT_AMBULATORY_CARE_PROVIDER_SITE_OTHER): Payer: Self-pay | Admitting: Surgery

## 2012-08-29 ENCOUNTER — Ambulatory Visit (INDEPENDENT_AMBULATORY_CARE_PROVIDER_SITE_OTHER): Payer: 59 | Admitting: Surgery

## 2012-08-29 VITALS — BP 120/80 | HR 72 | Temp 97.9°F | Resp 16 | Ht 68.0 in | Wt 154.0 lb

## 2012-08-29 DIAGNOSIS — C50519 Malignant neoplasm of lower-outer quadrant of unspecified female breast: Secondary | ICD-10-CM

## 2012-08-29 DIAGNOSIS — C50511 Malignant neoplasm of lower-outer quadrant of right female breast: Secondary | ICD-10-CM

## 2012-08-29 NOTE — Progress Notes (Addendum)
Re:   VIRIGINIA Nguyen DOB:   1955/08/08 MRN:   [id number]  [facility]  ASSESSMENT AND PLAN: 1.  Right Breast Cancer, 7 o'clock. 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 Dayton Scrape and [employer].  Lumpectomy (needle loc) and right axillary sentinel lymph node biopsy - 08/20/2012  Oncotype - pending.  She has appts with Drs. Dayton Scrape and Khan at the end of this month.  She'll see me back in 6 months.  [Oncotype recurrence score is 13.  Recurrence rate 8%.  DN 09/04/2012]  2.  Hypertension 3.  Hepatitis C - treated through [facility] - May 2011  She says she is disease free 4.  Treated for TB off lymph node biopsy - 11-19-2004 5.  Husband died from "bile duct cancer" - 08-20-12 Chief Complaint  Patient presents with  . Routine Post Op    post op breast    REFERRING PHYSICIAN: Lolita Patella, MD  HISTORY OF PRESENT ILLNESS: Sharon Nguyen is a 57 y.o. (DOB: Apr 24, 1956)  AA  female whose primary care physician is Lolita Patella, MD and comes for follow up of a right breast lumpectomy and right AXLN Bx.  She is doing well.  She has some soreness.    Breast Cancer History: The patient has had routine mammography. In fact about a year ago, to get her mammograms more frequently. She had a mammogram on 04/30/2012 showed an ill-defined nodule in the right lower outer quadrant. She had a biopsy on 06/10/2012 showed an invasive mammary carcinoma. There is no indication for an MRI. Her last period was around 2003 when she was 57 years old. She is not on hormone medication. She's had no prior history of breast disease or breast biopsy. Her husband died from bile duct cancer in 08/20/2012.   Past Medical History  Diagnosis Date  . Breast cancer   . Hypertension   . Hepatitis C   . TB (pulmonary tuberculosis)   . GERD (gastroesophageal reflux disease)      Current Outpatient Prescriptions   Medication Sig Dispense Refill  . amLODipine (NORVASC) 10 MG tablet Take 10 mg by mouth daily.      . fish oil-omega-3 fatty acids 1000 MG capsule Take 1 g by mouth daily.      Marland Kitchen HYDROcodone-acetaminophen (NORCO/VICODIN) 5-325 MG per tablet Take 1-2 tablets by mouth every 6 (six) hours as needed for pain.  30 tablet  1  . losartan-hydrochlorothiazide (HYZAAR) 100-25 MG per tablet Take 1 tablet by mouth daily.      . Multiple Vitamin (MULTIVITAMIN WITH MINERALS) TABS Take 1 tablet by mouth daily.       No current facility-administered medications for this visit.     Allergies  Allergen Reactions  . Procardia (Nifedipine)     headaches    REVIEW OF SYSTEMS: Skin:  No history of rash.   Infection: History of TB and Hep C.  She was treated at [facility] for the Hep C, but does not remember the physician's name. Neurologic:  No history of stroke.  No history of seizure.  No history of headaches. Cardiac:  Hypertension x 20 years. No history of heart disease.  No history of prior cardiac catheterization.  No history of seeing a cardiologist. Gastrointestinal:  No history of stomach disease.  No history of liver disease.  No  history of gall bladder disease.  No history of pancreas disease.  Colonoscopy in 2012 - she can't remember the physician.  SOCIAL and FAMILY HISTORY: Husband died from "bile duct cancer" - April 2014 She has 4 sons, 2 in [place], 1 in [place], and 1 in [place].  Her children do not know about her diagnosis. She works as a Engineer, civil (consulting) at [employer].  PHYSICAL EXAM: BP 120/80  Pulse 72  Temp(Src) 97.9 F (36.6 C)  Resp 16  Ht 5\' 8"  (1.727 m)  Wt 154 lb (69.854 kg)  BMI 23.42 kg/m2  General: WN thinner AA F who is alert and generally healthy appearing.  HEENT: Normal. Pupils equal. Neck: Supple. No mass.  No thyroid mass.  She has a scar over the medial right clavicle, where she had the biopsy that showed TB in 2006. Breasts:  Right -  Lower outer quadrant  incision okay.  Right axillary incision okay.  Still has some glue on it.  Left - No mass.  No nipple discharge.  DATA REVIEWED: Path report to patient.  Ovidio Kin, MD,  [facility] [address] [place]  [address]   [place], [place]    [address] Phone:  [phone] FAX:  [phone]

## 2012-08-30 ENCOUNTER — Telehealth: Payer: Self-pay | Admitting: *Deleted

## 2012-08-30 NOTE — Telephone Encounter (Signed)
I called patient to f/u from her surgery 4/29.  Left voicemail to return my call.

## 2012-09-02 ENCOUNTER — Encounter: Payer: Self-pay | Admitting: *Deleted

## 2012-09-02 NOTE — Progress Notes (Signed)
Received Oncotype Dx results of 13.  Gave copy to MD.  Rochele Pages copy to Med Rec to scan.

## 2012-09-05 ENCOUNTER — Other Ambulatory Visit: Payer: Self-pay | Admitting: Oncology

## 2012-09-05 ENCOUNTER — Telehealth: Payer: Self-pay | Admitting: *Deleted

## 2012-09-05 NOTE — Telephone Encounter (Signed)
sw pt informing her that KK would like for her to come in on 09/17/12 @11am . Pt is aware...td

## 2012-09-06 ENCOUNTER — Telehealth: Payer: Self-pay | Admitting: *Deleted

## 2012-09-06 NOTE — Telephone Encounter (Signed)
CALLED PATIENT TO ALTER Sharon Nguyen VISIT FOR 09-17-12 DUE TO THE MEMORIAL DAY HOLIDAY AND COVERAGE FOR HIS PUTS, RESCHEDULED FOR 09-19-12 12:30 PM FOR 1:00 PM APPT. SPOKE WITH PATIENT AND SHE HAS AGREED TO NEW TIME AND DATE

## 2012-09-12 ENCOUNTER — Ambulatory Visit: Payer: 59 | Admitting: Oncology

## 2012-09-12 ENCOUNTER — Other Ambulatory Visit: Payer: 59 | Admitting: Lab

## 2012-09-13 ENCOUNTER — Other Ambulatory Visit: Payer: 59 | Admitting: Lab

## 2012-09-13 ENCOUNTER — Ambulatory Visit: Payer: 59 | Admitting: Oncology

## 2012-09-17 ENCOUNTER — Other Ambulatory Visit: Payer: Self-pay | Admitting: Emergency Medicine

## 2012-09-17 ENCOUNTER — Ambulatory Visit: Payer: 59 | Admitting: Radiation Oncology

## 2012-09-17 ENCOUNTER — Ambulatory Visit: Payer: 59

## 2012-09-17 ENCOUNTER — Ambulatory Visit: Payer: 59 | Admitting: Oncology

## 2012-09-18 ENCOUNTER — Encounter: Payer: Self-pay | Admitting: Radiation Oncology

## 2012-09-18 NOTE — Progress Notes (Signed)
Location of Breast Cancer:Right breast 7 o'clock position.  Histology per Pathology Report: Right breast invasive mammary cancer  Receptor Status: ER(+), PR (+), Her2-neu (-)  Found on screening mammogram of right lower quadrant on 04/30/2012.Right breast biopsy on 06/10/12 reveals invasive mammary cancer.  Past/Anticipated interventions by surgeon, if YNW:GNFAO breast lumpectomy and axillary sentinel lymph node biopsy on 08/20/2012.  Past/Anticipated interventions by medical oncology, if ZHY:QMVHQION test  performed with results of 13 ,to discuss results with Dr.Kahn and inform if chemotherapy will be required or not..No chemotherapy required per Dr. Welton Flakes. Follow up with medical oncology after radiation.  Lymphedema issues, if any: No swelling but has some axillary numbness  Pain issues, if GEX:BMWU    SAFETY ISSUES:  Prior radiation? No  Pacemaker/ICD? No  Possible current pregnancy? No  Is the patient on methotrexate? No  Current Complaints / other details:Patient had put her  treatment on hold  as husband was being treated for bile duct cancer which he succumbed to in April of 2014.Patient has 4 sons.Patient employed at [employer] as Engineer, civil (consulting). Menarche at age 2 First full term birth at age 57 No HRT Last menstrual period in her 73's.

## 2012-09-19 ENCOUNTER — Encounter: Payer: Self-pay | Admitting: *Deleted

## 2012-09-19 ENCOUNTER — Ambulatory Visit
Admission: RE | Admit: 2012-09-19 | Discharge: 2012-09-19 | Disposition: A | Discharge status: Home / Self Care | Payer: 59 | Source: Ambulatory Visit | Attending: Radiation Oncology | Admitting: Radiation Oncology

## 2012-09-19 ENCOUNTER — Telehealth: Payer: Self-pay | Admitting: Oncology

## 2012-09-19 ENCOUNTER — Other Ambulatory Visit (HOSPITAL_BASED_OUTPATIENT_CLINIC_OR_DEPARTMENT_OTHER): Payer: 59 | Admitting: Lab

## 2012-09-19 ENCOUNTER — Ambulatory Visit (HOSPITAL_BASED_OUTPATIENT_CLINIC_OR_DEPARTMENT_OTHER): Payer: 59 | Admitting: Oncology

## 2012-09-19 VITALS — BP 134/88 | HR 79 | Temp 98.8°F | Resp 20 | Wt 152.1 lb

## 2012-09-19 VITALS — BP 134/88 | HR 79 | Temp 98.8°F | Resp 20 | Ht 68.0 in | Wt 152.8 lb

## 2012-09-19 DIAGNOSIS — C50519 Malignant neoplasm of lower-outer quadrant of unspecified female breast: Secondary | ICD-10-CM | POA: Insufficient documentation

## 2012-09-19 DIAGNOSIS — Z9010 Acquired absence of unspecified breast and nipple: Secondary | ICD-10-CM | POA: Insufficient documentation

## 2012-09-19 DIAGNOSIS — C50919 Malignant neoplasm of unspecified site of unspecified female breast: Secondary | ICD-10-CM

## 2012-09-19 DIAGNOSIS — C50511 Malignant neoplasm of lower-outer quadrant of right female breast: Secondary | ICD-10-CM

## 2012-09-19 DIAGNOSIS — Z170 Estrogen receptor positive status [ER+]: Secondary | ICD-10-CM | POA: Insufficient documentation

## 2012-09-19 LAB — CBC WITH DIFFERENTIAL/PLATELET
BASO%: 0.9 % (ref 0.0–2.0)
Basophils Absolute: 0 10*3/uL (ref 0.0–0.1)
EOS%: 2.9 % (ref 0.0–7.0)
Eosinophils Absolute: 0.2 10*3/uL (ref 0.0–0.5)
HCT: 39.8 % (ref 34.8–46.6)
HGB: 13.3 g/dL (ref 11.6–15.9)
LYMPH%: 30.3 % (ref 14.0–49.7)
MCH: 28.9 pg (ref 25.1–34.0)
MCHC: 33.4 g/dL (ref 31.5–36.0)
MCV: 86.6 fL (ref 79.5–101.0)
MONO#: 0.4 10*3/uL (ref 0.1–0.9)
MONO%: 7.1 % (ref 0.0–14.0)
NEUT#: 3.2 10*3/uL (ref 1.5–6.5)
NEUT%: 58.8 % (ref 38.4–76.8)
Platelets: 138 10*3/uL — ABNORMAL LOW (ref 145–400)
RBC: 4.59 10*6/uL (ref 3.70–5.45)
RDW: 13.6 % (ref 11.2–14.5)
WBC: 5.4 10*3/uL (ref 3.9–10.3)
lymph#: 1.6 10*3/uL (ref 0.9–3.3)

## 2012-09-19 LAB — COMPREHENSIVE METABOLIC PANEL (CC13)
ALT: 17 U/L (ref 0–55)
AST: 17 U/L (ref 5–34)
Albumin: 4.2 g/dL (ref 3.5–5.0)
Alkaline Phosphatase: 84 U/L (ref 40–150)
BUN: 13.6 mg/dL (ref 7.0–26.0)
CO2: 27 mEq/L (ref 22–29)
Calcium: 10 mg/dL (ref 8.4–10.4)
Chloride: 104 mEq/L (ref 98–107)
Creatinine: 0.8 mg/dL (ref 0.6–1.1)
Glucose: 88 mg/dl (ref 70–99)
Potassium: 3.7 mEq/L (ref 3.5–5.1)
Sodium: 140 mEq/L (ref 136–145)
Total Bilirubin: 0.64 mg/dL (ref 0.20–1.20)
Total Protein: 8 g/dL (ref 6.4–8.3)

## 2012-09-19 NOTE — Progress Notes (Signed)
Patient at [facility] to see Dr. Welton Flakes.  Patient accompanied by her sister-in-law as her support person.  She reports that she is doing well.  She reports that her surgery site is healing well with minimal discomfort and that she has some tingling in the surgical area which is improved when she wears a sports bra.  She does have moments of sadness following reminders of her husband who passed away in Aug 30, 2022,  such as coming to [facility].  She expressed happiness that her son and his family have moved to [place] from [place] and are currently living with her which provides a distraction from her sadness. Patient denied any questions or concerns at this time.  I encouraged her to call if she has any questions following her appointments and for any needs.

## 2012-09-19 NOTE — Progress Notes (Signed)
CC: Dr. Elias Else, Dr. 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 is seen today for review and scheduling of her radiation therapy in the management of her T1 N0 invasive ductal carcinoma the right breast. I saw the patient at the [facility] on 06/26/2012 hours time she presented with a mass along the lower outer quadrant of the right breast. On ultrasound she is found to have a 0.4 x 0.3 x 0.4 cm nodule 8:00. A student at the biopsy was diagnostic for invasive mammary carcinoma. The tumor strongly ER/PR positive at 100% with a proliferation marker of 22%. Dr. Ezzard Standing performed a right partial mastectomy on 08/20/2012. She was found to have a 1.0 cm invasive ductal carcinoma with extravasated reason (colloid carcinoma) with invasive tumor 1 mm from the nearest inferior margin and 2 mm from the nearest anterior margin. 3 sentinel lymph nodes were free of metastatic disease. Her Oncotype DX score was 13 indicating a distant recurrence rate of 8%. She was not felt to be a candidate for chemotherapy. She is without complaints today  Physical examination: Alert and oriented 57 year old after American female appearing younger than her stated age.  Filed Vitals:   09/19/12 1305  BP: 134/88  Pulse: 79  Temp: 98.8 F (37.1 C)  Resp: 20   Head and neck examination: Grossly unremarkable. Nodes: Without palpable cervical, supraclavicular, or axillary lymphadenopathy. Chest: Lungs clear. Breasts: There is a partial mastectomy wound along the lower outer quadrant of the right breast extending from 6 to 8:00. No masses are appreciated. Left breast without masses or lesions. Abdomen without hepatomegaly. Extremities: Without edema.  Impression: Stage I (T1, N0, M0) invasive ductal carcinoma of the right breast. We discussed the potential acute and late toxicities of radiation therapy, and consent is signed today. She will return  for simulation/treatment planning early next week. She will receive hypo-fractionated radiation therapy, and will receive a boost based on her close surgical margins.  Plan: As discussed above.  30 minutes was spent face-to-face the patient, primarily counseling the patient and coordinating her care.

## 2012-09-20 NOTE — Addendum Note (Signed)
Encounter addended by: Tessa Lerner, RN on: 09/20/2012  3:37 PM<BR>     Documentation filed: Charges VN

## 2012-09-23 ENCOUNTER — Ambulatory Visit
Admission: RE | Admit: 2012-09-23 | Discharge: 2012-09-23 | Disposition: A | Discharge status: Home / Self Care | Payer: 59 | Source: Ambulatory Visit | Attending: Radiation Oncology | Admitting: Radiation Oncology

## 2012-09-23 DIAGNOSIS — C50519 Malignant neoplasm of lower-outer quadrant of unspecified female breast: Secondary | ICD-10-CM | POA: Insufficient documentation

## 2012-09-23 DIAGNOSIS — C50511 Malignant neoplasm of lower-outer quadrant of right female breast: Secondary | ICD-10-CM

## 2012-09-23 DIAGNOSIS — L539 Erythematous condition, unspecified: Secondary | ICD-10-CM | POA: Insufficient documentation

## 2012-09-23 DIAGNOSIS — Z510 Encounter for antineoplastic radiation therapy: Secondary | ICD-10-CM | POA: Insufficient documentation

## 2012-09-23 NOTE — Progress Notes (Signed)
Complex simulation/treatment planning note: The patient was taken to the CT simulator. She was placed on a custom breast board with a custom Acuform mold for immobilization. Her field borders were marked with radiopaque wires along with a partial mastectomy scar. She was then scanned. The CT data set was sent to the planning system for contouring of her normal structures including her lumpectomy bed. She was set up to medial and lateral right breast tangents and 2 separate multileaf collimators were designed to conform the field. I prescribing 4250 cGy in 17 sessions utilizing 6 MV photons. This be followed by a boost to her tumor bed for a further 750 cGy 3 sessions.

## 2012-09-30 ENCOUNTER — Ambulatory Visit
Admission: RE | Admit: 2012-09-30 | Discharge: 2012-09-30 | Disposition: A | Discharge status: Home / Self Care | Payer: 59 | Source: Ambulatory Visit | Attending: Radiation Oncology | Admitting: Radiation Oncology

## 2012-09-30 DIAGNOSIS — C50511 Malignant neoplasm of lower-outer quadrant of right female breast: Secondary | ICD-10-CM

## 2012-09-30 NOTE — Progress Notes (Signed)
Simulation verification note: The patient underwent simulation verification for treatment to her right breast. Her isocenter is in good position and the multileaf collimators contoured the treatment volume appropriately. 

## 2012-10-01 ENCOUNTER — Ambulatory Visit
Admission: RE | Admit: 2012-10-01 | Discharge: 2012-10-01 | Disposition: A | Discharge status: Home / Self Care | Payer: 59 | Source: Ambulatory Visit | Attending: Radiation Oncology | Admitting: Radiation Oncology

## 2012-10-02 ENCOUNTER — Ambulatory Visit
Admission: RE | Admit: 2012-10-02 | Discharge: 2012-10-02 | Disposition: A | Discharge status: Home / Self Care | Payer: 59 | Source: Ambulatory Visit | Attending: Radiation Oncology | Admitting: Radiation Oncology

## 2012-10-03 ENCOUNTER — Ambulatory Visit: Payer: 59 | Admitting: Oncology

## 2012-10-03 ENCOUNTER — Ambulatory Visit
Admission: RE | Admit: 2012-10-03 | Discharge: 2012-10-03 | Disposition: A | Discharge status: Home / Self Care | Payer: 59 | Source: Ambulatory Visit | Attending: Radiation Oncology | Admitting: Radiation Oncology

## 2012-10-03 DIAGNOSIS — C50511 Malignant neoplasm of lower-outer quadrant of right female breast: Secondary | ICD-10-CM

## 2012-10-03 MED ORDER — ALRA NON-METALLIC DEODORANT (RAD-ONC): 1.0000 "application " | Freq: Once | TOPICAL | Status: DC

## 2012-10-03 MED ORDER — RADIAPLEXRX EX GEL: Freq: Once | CUTANEOUS | Status: DC

## 2012-10-03 NOTE — Progress Notes (Signed)
Reviewed routine of clinic, doctor day generally on Monday after radiation treatment.Given Radiation Therapy and You Booklet and reviewed side effects to include skin changes, pain and fatigue.Given alra deodorant and radiaplex gel.Patient able to verbalize side effects using teach back method.

## 2012-10-04 ENCOUNTER — Ambulatory Visit
Admission: RE | Admit: 2012-10-04 | Discharge: 2012-10-04 | Disposition: A | Discharge status: Home / Self Care | Payer: 59 | Source: Ambulatory Visit | Attending: Radiation Oncology | Admitting: Radiation Oncology

## 2012-10-07 ENCOUNTER — Ambulatory Visit
Admission: RE | Admit: 2012-10-07 | Discharge: 2012-10-07 | Disposition: A | Discharge status: Home / Self Care | Payer: 59 | Source: Ambulatory Visit | Attending: Radiation Oncology | Admitting: Radiation Oncology

## 2012-10-07 ENCOUNTER — Encounter: Payer: Self-pay | Admitting: Radiation Oncology

## 2012-10-07 VITALS — BP 121/74 | HR 83 | Temp 98.7°F | Resp 20 | Wt 156.0 lb

## 2012-10-07 DIAGNOSIS — C50511 Malignant neoplasm of lower-outer quadrant of right female breast: Secondary | ICD-10-CM

## 2012-10-07 NOTE — Progress Notes (Signed)
Weekly Management Note:  Site: Right breast Current Dose:  1250  cGy Projected Dose: 4250  cGy followed by right breast boost 750 cGy 3 sessions  Narrative: The patient is seen today for routine under treatment assessment. CBCT/MVCT images/port films were reviewed. The chart was reviewed.   No complaints today. She has Radioplex gel to use when necessary.  Physical Examination:  Filed Vitals:   10/07/12 1549  BP: 121/74  Pulse: 83  Temp: 98.7 F (37.1 C)  Resp: 20  .  Weight: 156 lb (70.761 kg). No significant skin changes.  Impression: Tolerating radiation therapy well.  Plan: Continue radiation therapy as planned.

## 2012-10-07 NOTE — Progress Notes (Signed)
Pt denies pain, fatigue, loss of appetite. She is applying Radiaplex to right breast treatment area. 

## 2012-10-08 ENCOUNTER — Ambulatory Visit
Admission: RE | Admit: 2012-10-08 | Discharge: 2012-10-08 | Disposition: A | Discharge status: Home / Self Care | Payer: 59 | Source: Ambulatory Visit | Attending: Radiation Oncology | Admitting: Radiation Oncology

## 2012-10-09 ENCOUNTER — Encounter: Payer: Self-pay | Admitting: Radiation Oncology

## 2012-10-09 ENCOUNTER — Ambulatory Visit
Admission: RE | Admit: 2012-10-09 | Discharge: 2012-10-09 | Disposition: A | Discharge status: Home / Self Care | Payer: 59 | Source: Ambulatory Visit | Attending: Radiation Oncology | Admitting: Radiation Oncology

## 2012-10-09 NOTE — Progress Notes (Signed)
Electron beam simulation note: The patient underwent virtual simulation for her right breast boost with 15 MEV electrons. Electron beam energy was chosen based on the depth of her tumor bed as seen on her treatment planning CT scan. One custom block is constructed to conform the field. A special port plan is requested. I prescribing 750 cGy in 3 sessions

## 2012-10-10 ENCOUNTER — Ambulatory Visit
Admission: RE | Admit: 2012-10-10 | Discharge: 2012-10-10 | Disposition: A | Discharge status: Home / Self Care | Payer: 59 | Source: Ambulatory Visit | Attending: Radiation Oncology | Admitting: Radiation Oncology

## 2012-10-11 ENCOUNTER — Ambulatory Visit: Payer: 59

## 2012-10-13 NOTE — Progress Notes (Signed)
OFFICE PROGRESS NOTE  CC  Sharon Patella, MD [facility] [address] [place] [address] Dr. Ovidio Kin  Dr. Chipper Herb   DIAGNOSIS: 57 year old female with new diagnosis of invasive ductal carcinoma of the right breast (T1 N0)  STAGE:  Cancer of lower-outer quadrant of female breast  Primary site: Breast (Right)  Staging method: AJCC 7th Edition  Clinical: Stage IA (T1c, N0, cM0)  Summary: Stage IA (T1c, N0, cM0)   PRIOR THERAPY: #1On 04/30/2012 patient had a short interval followup of a nodule along the right lower quadrant of the posterior right breast. Interval nodule was seen again along the right lower quadrant. She had an ultrasound done that showed a cyst at the 6:00 position and a 4 x 3 x 4 mm hypoechoic nodule at the 8:00 position 8 cm from the nipple. This was felt to be smaller than a nodule noted in the mammogram and did not correlate to the mammographic findings  #2 Patient had a stereotactic biopsy performed on 06/10/2012. The pathology showed a invasive mammary carcinoma with extravasated mucin. Tumor was low grade ER positive PR +100% with a proliferation marker Ki-67 of 22%.  #3 s/p right needle loc lumpectomy with right axillary sentinel lymph node biopsy on 08/20/12 with path  1.0 cm IDC with extravasated mucin, 0/3 nodes, ER 100%, PR 100% Her2Neu neg, Ki-67 22%  #3 Oncotype Dx performed on 5/14 with recurrnece score of 13 and distant recurrence rate of 8% , in the low risk category.  #4 proceed with RT adjuvantly prior to institution of anti-estrogen therapy with an AI  CURRENT THERAPY: proceed with RT  INTERVAL HISTORY: Sharon Nguyen 57 y.o. female returns for follow up visit overall she is doing well. She is receiving radiation therapy. She is feeling well. No nausea or vomiting. Remainder of the 10 point review of system is negative  MEDICAL HISTORY: Past Medical History  Diagnosis Date  . Breast cancer    . Hypertension   . Hepatitis C   . TB (pulmonary tuberculosis)   . GERD (gastroesophageal reflux disease)     ALLERGIES:  is allergic to procardia.  MEDICATIONS:  Current Outpatient Prescriptions  Medication Sig Dispense Refill  . amLODipine (NORVASC) 10 MG tablet Take 10 mg by mouth daily.      . fish oil-omega-3 fatty acids 1000 MG capsule Take 1 g by mouth daily.      Marland Kitchen losartan-hydrochlorothiazide (HYZAAR) 100-25 MG per tablet Take 1 tablet by mouth daily.      . Multiple Vitamin (MULTIVITAMIN WITH MINERALS) TABS Take 1 tablet by mouth daily.      . hyaluronate sodium (RADIAPLEXRX) GEL Apply topically 2 (two) times daily.      Marland Kitchen HYDROcodone-acetaminophen (NORCO/VICODIN) 5-325 MG per tablet Take 1-2 tablets by mouth every 6 (six) hours as needed for pain.  30 tablet  1   No current facility-administered medications for this visit.    SURGICAL HISTORY:  Past Surgical History  Procedure Laterality Date  . Thyroid cyst excision    . Tubal ligation    . Mandible fracture surgery    . Breast lumpectomy with needle localization and axillary sentinel lymph node bx Right 08/20/2012    Procedure: BREAST LUMPECTOMY WITH NEEDLE LOCALIZATION AND AXILLARY SENTINEL LYMPH NODE BX;  Surgeon: Kandis Cocking, MD;  Location: [facility] OR;  Service: General;  Laterality: Right;  . Breast surgery      REVIEW OF SYSTEMS:  Pertinent items are  noted in HPI.   HEALTH MAINTENANCE:   PHYSICAL EXAMINATION: Blood pressure 134/88, pulse 79, temperature 98.8 F (37.1 C), temperature source Oral, resp. rate 20, height 5\' 8"  (1.727 m), weight 152 lb 12.8 oz (69.31 kg). Body mass index is 23.24 kg/(m^2). ECOG PERFORMANCE STATUS: 0 - Asymptomatic   General appearance: alert, cooperative and appears stated age Resp: clear to auscultation bilaterally Cardio: regular rate and rhythm GI: soft, non-tender; bowel sounds normal; no masses,  no organomegaly Extremities: extremities normal, atraumatic, no cyanosis or  edema Neurologic: Grossly normal   LABORATORY DATA: Lab Results  Component Value Date   WBC 5.4 09/19/2012   HGB 13.3 09/19/2012   HCT 39.8 09/19/2012   MCV 86.6 09/19/2012   PLT 138* 09/19/2012      Chemistry      Component Value Date/Time   NA 140 09/19/2012 1106   NA 140 08/14/2012 1517   K 3.7 09/19/2012 1106   K 3.4* 08/14/2012 1517   CL 104 09/19/2012 1106   CL 101 08/14/2012 1517   CO2 27 09/19/2012 1106   CO2 28 08/14/2012 1517   BUN 13.6 09/19/2012 1106   BUN 17 08/14/2012 1517   CREATININE 0.8 09/19/2012 1106   CREATININE 1.01 08/14/2012 1517      Component Value Date/Time   CALCIUM 10.0 09/19/2012 1106   CALCIUM 10.3 08/14/2012 1517   ALKPHOS 84 09/19/2012 1106   ALKPHOS 81 08/14/2012 1517   AST 17 09/19/2012 1106   AST 22 08/14/2012 1517   ALT 17 09/19/2012 1106   ALT 15 08/14/2012 1517   BILITOT 0.64 09/19/2012 1106   BILITOT 0.3 08/14/2012 1517       RADIOGRAPHIC STUDIES:  No results found.  ASSESSMENT: 57 year old female with   1. Stage I (T1N0) IDC s/p lumpectomy and SLN.  2. Now receiving RT tolerating it well  3. Once she finishes the RT she wil go on to adjuvant anti-estrogen with an AI   PLAN:  1. Continue RT  2. I will see her back in 1 month in follow up or sooner   All questions were answered. The patient knows to call the clinic with any problems, questions or concerns. We can certainly see the patient much sooner if necessary.  I spent 25 minutes counseling the patient face to face. The total time spent in the appointment was 30 minutes.    Drue Second, MD Medical/Oncology [facility] [phone] (beeper) [phone] (Office)  10/13/2012, 9:13 PM

## 2012-10-14 ENCOUNTER — Ambulatory Visit: Admission: RE | Admit: 2012-10-14 | Payer: 59 | Source: Ambulatory Visit

## 2012-10-14 ENCOUNTER — Ambulatory Visit: Payer: 59

## 2012-10-15 ENCOUNTER — Ambulatory Visit: Payer: 59

## 2012-10-16 ENCOUNTER — Ambulatory Visit: Payer: 59

## 2012-10-17 ENCOUNTER — Ambulatory Visit
Admission: RE | Admit: 2012-10-17 | Discharge: 2012-10-17 | Disposition: A | Discharge status: Home / Self Care | Payer: 59 | Source: Ambulatory Visit | Attending: Radiation Oncology | Admitting: Radiation Oncology

## 2012-10-17 DIAGNOSIS — C50511 Malignant neoplasm of lower-outer quadrant of right female breast: Secondary | ICD-10-CM

## 2012-10-17 NOTE — Progress Notes (Signed)
Weekly Management Note:  Site: Right breast Current Dose:  2250  cGy Projected Dose: 4250  cGy followed by boost of 750 cGy 3 sessions  Narrative: The patient is seen today for routine under treatment assessment. CBCT/MVCT images/port films were reviewed. The chart was reviewed.   She is without complaints today.  Physical Examination: There were no vitals filed for this visit..  Weight:  . There is hyperpigmentation the skin along the right breast with no areas of desquamation.  Impression: Tolerating radiation therapy well. Because of her missing 4 treatments we will treat her at this Saturday and then twice a day one day next week .  Plan: Continue radiation therapy as planned.

## 2012-10-17 NOTE — Progress Notes (Signed)
Sharon Nguyen has received 9 fractions to her right breast. She is c/o tenderness right breast/  Mild hyperpigmentation, but skin soft and remains intact.  Denies any fatigue.

## 2012-10-18 ENCOUNTER — Ambulatory Visit
Admission: RE | Admit: 2012-10-18 | Discharge: 2012-10-18 | Disposition: A | Discharge status: Home / Self Care | Payer: 59 | Source: Ambulatory Visit | Attending: Radiation Oncology | Admitting: Radiation Oncology

## 2012-10-19 ENCOUNTER — Ambulatory Visit
Admission: RE | Admit: 2012-10-19 | Discharge: 2012-10-19 | Disposition: A | Discharge status: Home / Self Care | Payer: 59 | Source: Ambulatory Visit | Attending: Radiation Oncology | Admitting: Radiation Oncology

## 2012-10-21 ENCOUNTER — Ambulatory Visit
Admission: RE | Admit: 2012-10-21 | Discharge: 2012-10-21 | Disposition: A | Discharge status: Home / Self Care | Payer: 59 | Source: Ambulatory Visit | Attending: Radiation Oncology | Admitting: Radiation Oncology

## 2012-10-21 ENCOUNTER — Ambulatory Visit: Payer: 59

## 2012-10-21 DIAGNOSIS — C50511 Malignant neoplasm of lower-outer quadrant of right female breast: Secondary | ICD-10-CM

## 2012-10-21 NOTE — Progress Notes (Signed)
   Weekly Management Note:  outpatient Current Dose:  30 Gy  Projected Dose: 50 Gy   Narrative:  The patient presents for routine under treatment assessment.  CBCT/MVCT images/Port film x-rays were reviewed.  The chart was checked. No new complaints. Has right breast soreness.  Physical Findings:  vitals were not taken for this visit. modest hyperpigmentation of R  breast  Impression:  The patient is tolerating radiotherapy.  Plan:  Continue radiotherapy as planned.  ________________________________   Lonie Peak, M.D.

## 2012-10-22 ENCOUNTER — Encounter: Payer: Self-pay | Admitting: *Deleted

## 2012-10-22 ENCOUNTER — Ambulatory Visit
Admission: RE | Admit: 2012-10-22 | Discharge: 2012-10-22 | Disposition: A | Discharge status: Home / Self Care | Payer: 59 | Source: Ambulatory Visit | Attending: Radiation Oncology | Admitting: Radiation Oncology

## 2012-10-22 NOTE — Progress Notes (Signed)
Patient at [facility] for Radiation Therapy treatment.  Patient reports that she is doing well.  She denies any complaints.  She did say that she had some concern that her treatments were interrupted due to upgrading of the machine.  We discussed how the amount of radiation she receives is monitored so that she does receive the total prescribed amount. She reports that she is attending a grief class at a local church which has been helpful following the death of her husband this spring.  I encouraged her to call for any needs or concerns.

## 2012-10-23 ENCOUNTER — Ambulatory Visit
Admission: RE | Admit: 2012-10-23 | Discharge: 2012-10-23 | Disposition: A | Discharge status: Home / Self Care | Payer: 59 | Source: Ambulatory Visit | Attending: Radiation Oncology | Admitting: Radiation Oncology

## 2012-10-24 ENCOUNTER — Ambulatory Visit
Admission: RE | Admit: 2012-10-24 | Discharge: 2012-10-24 | Disposition: A | Discharge status: Home / Self Care | Payer: 59 | Source: Ambulatory Visit | Attending: Radiation Oncology | Admitting: Radiation Oncology

## 2012-10-24 DIAGNOSIS — C50511 Malignant neoplasm of lower-outer quadrant of right female breast: Secondary | ICD-10-CM

## 2012-10-24 MED ORDER — RADIAPLEXRX EX GEL
Freq: Once | CUTANEOUS | Status: AC
  Administered 2012-10-24: 18:00:00 via TOPICAL

## 2012-10-28 ENCOUNTER — Ambulatory Visit
Admission: RE | Admit: 2012-10-28 | Discharge: 2012-10-28 | Disposition: A | Discharge status: Home / Self Care | Payer: 59 | Source: Ambulatory Visit | Attending: Radiation Oncology | Admitting: Radiation Oncology

## 2012-10-28 ENCOUNTER — Ambulatory Visit: Payer: 59

## 2012-10-28 VITALS — BP 118/79 | HR 81 | Temp 97.8°F | Ht 68.0 in | Wt 155.7 lb

## 2012-10-28 DIAGNOSIS — C50511 Malignant neoplasm of lower-outer quadrant of right female breast: Secondary | ICD-10-CM

## 2012-10-28 NOTE — Progress Notes (Signed)
Weekly Management Note:  Site: Right Current Dose:  4250  cGy Projected Dose: 4250  cGy followed by boost of 750 cGy in 3 sessions  Narrative: The patient is seen today for routine under treatment assessment. CBCT/MVCT images/port films were reviewed. The chart was reviewed.   She does report right breast "soreness" and fatigued her  Physical Examination:  Filed Vitals:   10/28/12 1629  BP: 118/79  Pulse: 81  Temp: 97.8 F (36.6 C)  .  Weight: 155 lb 11.2 oz (70.625 kg). There is moderate hyperpigmentation the skin along the right breast with moderate erythema. There is patchy dry desquamation.  Impression: Tolerating radiation therapy well. She'll finish her radiation therapy this Thursday.  Plan: Continue radiation therapy as planned. One-month followup visit after completion of radiation therapy.

## 2012-10-28 NOTE — Progress Notes (Signed)
Sharon Nguyen here for weekly under treat visit.  She has had 17 fractions to her right breast.  She does have some tenderness in her right breast that she is rating at a 3/10.  She does have some redness and hyperpigmentation on her right breast.  She is using radiaplex gel twice a day.  She does have fatigue.

## 2012-10-29 ENCOUNTER — Ambulatory Visit: Payer: 59

## 2012-10-29 ENCOUNTER — Ambulatory Visit
Admission: RE | Admit: 2012-10-29 | Discharge: 2012-10-29 | Disposition: A | Discharge status: Home / Self Care | Payer: 59 | Source: Ambulatory Visit | Attending: Radiation Oncology | Admitting: Radiation Oncology

## 2012-10-30 ENCOUNTER — Ambulatory Visit: Payer: 59

## 2012-10-30 ENCOUNTER — Ambulatory Visit
Admission: RE | Admit: 2012-10-30 | Discharge: 2012-10-30 | Disposition: A | Discharge status: Home / Self Care | Payer: 59 | Source: Ambulatory Visit | Attending: Radiation Oncology | Admitting: Radiation Oncology

## 2012-10-31 ENCOUNTER — Ambulatory Visit: Payer: 59

## 2012-10-31 ENCOUNTER — Ambulatory Visit
Admission: RE | Admit: 2012-10-31 | Discharge: 2012-10-31 | Disposition: A | Discharge status: Home / Self Care | Payer: 59 | Source: Ambulatory Visit | Attending: Radiation Oncology | Admitting: Radiation Oncology

## 2012-11-01 ENCOUNTER — Ambulatory Visit: Payer: 59

## 2012-11-01 ENCOUNTER — Encounter: Payer: Self-pay | Admitting: Radiation Oncology

## 2012-11-01 NOTE — Progress Notes (Signed)
[facility] Radiation Oncology End of Treatment Note  Name:Sharon Nguyen  Date: 11/01/2012 ZOX:[id number] DOB:1955/12/11   Status:outpatient    CC: Lolita Patella, MD    REFERRING PHYSICIAN: Dr. 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 15 MEV electrons, right breast boost              NARRATIVE:   Sharon Nguyen tolerated treatment well with the expected degree of radiation dermatitis completion of therapy. She used Radioplex gel during her course of therapy. No areas of moist desquamation by completion of therapy.                         PLAN: Routine followup in one month. Patient instructed to call if questions or worsening complaints in interim.

## 2012-11-02 ENCOUNTER — Ambulatory Visit: Payer: 59

## 2012-12-03 ENCOUNTER — Encounter: Payer: Self-pay | Admitting: Radiation Oncology

## 2012-12-10 ENCOUNTER — Ambulatory Visit: Payer: 59 | Admitting: Radiation Oncology

## 2012-12-13 ENCOUNTER — Encounter: Payer: Self-pay | Admitting: Radiation Oncology

## 2012-12-17 ENCOUNTER — Ambulatory Visit
Admission: RE | Admit: 2012-12-17 | Discharge: 2012-12-17 | Disposition: A | Discharge status: Home / Self Care | Payer: 59 | Source: Ambulatory Visit | Attending: Radiation Oncology | Admitting: Radiation Oncology

## 2012-12-17 VITALS — BP 130/83 | HR 86 | Temp 98.5°F | Ht 66.0 in | Wt 161.2 lb

## 2012-12-17 DIAGNOSIS — C50511 Malignant neoplasm of lower-outer quadrant of right female breast: Secondary | ICD-10-CM

## 2012-12-17 HISTORY — DX: Personal history of irradiation: Z92.3

## 2012-12-17 NOTE — Progress Notes (Signed)
Followup note:  [name] returns today approximately 6 weeks following completion of radiation therapy following conservative surgery in the management of her T1 N0 invasive ductal carcinoma the right breast. She is without complaints today. 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 She'll see Dr. Welton Flakes later this week for discussion of adjuvant hormone therapy. She does not yet have an appointment to see Dr. Ezzard Standing.  Physical examination: Alert and oriented. She looks well. Filed Vitals:   12/17/12 1136  BP: 130/83  Pulse: 86  Temp: 98.5 F (36.9 C)   Head and neck examination: Grossly unremarkable. Nodes: Without palpable cervical, supraclavicular, or axillary lymphadenopathy. Chest: Lungs clear. Breasts: There is residual hyperpigmentation the skin along the right breast. There is residual thickening along her partial mastectomy scar/tumor bed. No masses are appreciated. Left breast without masses or lesions. Abdomen without hepatomegaly. Extremities: Without edema.  Impression: Satisfactory progress.  Plan: She'll see Dr. Welton Flakes later this week for discussion of adjuvant hormone therapy. She typically gets her annual mammography in February, and I think it would be satisfactory for her to wait until February to have a diagnostic mammogram on the right and a [facility]. She'll also maintain her followup with Dr. Ezzard Standing.

## 2012-12-17 NOTE — Progress Notes (Signed)
Sharon Nguyen returns for follow up after treatment to her right breast.  She denies pain today.  She does have fatigue.  The skin on her right breast has some hyperpigmentation and peeling.  She also has peeling underneath her right arm.  She is using radiaplex gel.

## 2012-12-20 ENCOUNTER — Telehealth: Payer: Self-pay | Admitting: *Deleted

## 2012-12-20 ENCOUNTER — Encounter: Payer: Self-pay | Admitting: Adult Health

## 2012-12-20 ENCOUNTER — Ambulatory Visit (HOSPITAL_BASED_OUTPATIENT_CLINIC_OR_DEPARTMENT_OTHER): Payer: 59 | Admitting: Adult Health

## 2012-12-20 ENCOUNTER — Encounter: Payer: Self-pay | Admitting: *Deleted

## 2012-12-20 VITALS — BP 136/85 | HR 72 | Temp 98.3°F | Resp 20 | Ht 66.0 in | Wt 160.9 lb

## 2012-12-20 DIAGNOSIS — Z170 Estrogen receptor positive status [ER+]: Secondary | ICD-10-CM

## 2012-12-20 DIAGNOSIS — C50519 Malignant neoplasm of lower-outer quadrant of unspecified female breast: Secondary | ICD-10-CM

## 2012-12-20 DIAGNOSIS — C50511 Malignant neoplasm of lower-outer quadrant of right female breast: Secondary | ICD-10-CM

## 2012-12-20 MED ORDER — ANASTROZOLE 1 MG PO TABS: 1.0000 mg | ORAL_TABLET | Freq: Every day | ORAL | Status: DC

## 2012-12-20 NOTE — Progress Notes (Signed)
Patient at [facility] for f/u visit with Augustin Schooling, NP.  Patient reports that she is doing pretty well.  She has been going to "[employer]" to assist with her grieving following the death of her husband this past spring.  She says that she does well if she keeps herself busy.  She denies any pain or discomfort.  She reports that she continues to have some discoloration of the skin from her radiation treatments.  The skin is intact.  She continues to experience some fatigue but is working.  Patient denies any questions or concerns at this time.  She was encouraged to call me for any needs.

## 2012-12-20 NOTE — Telephone Encounter (Signed)
appts made and printed...td 

## 2012-12-20 NOTE — Progress Notes (Signed)
OFFICE PROGRESS NOTE  CC  Sharon Patella, MD [facility] [address] [place] [address] Dr. Ovidio Kin  Dr. Chipper Herb   DIAGNOSIS: 57 year old female with new diagnosis of invasive ductal carcinoma of the right breast (T1 N0)  STAGE:  Cancer of lower-outer quadrant of female breast  Primary site: Breast (Right)  Staging method: AJCC 7th Edition  Clinical: Stage IA (T1c, N0, cM0)  Summary: Stage IA (T1c, N0, cM0)   PRIOR THERAPY: #1On 04/30/2012 patient had a short interval followup of a nodule along the right lower quadrant of the posterior right breast. Interval nodule was seen again along the right lower quadrant. She had an ultrasound done that showed a cyst at the 6:00 position and a 4 x 3 x 4 mm hypoechoic nodule at the 8:00 position 8 cm from the nipple. This was felt to be smaller than a nodule noted in the mammogram and did not correlate to the mammographic findings  #2 Patient had a stereotactic biopsy performed on 06/10/2012. The pathology showed a invasive mammary carcinoma with extravasated mucin. Tumor was low grade ER positive PR +100% with a proliferation marker Ki-67 of 22%.  #3 s/p right needle loc lumpectomy with right axillary sentinel lymph node biopsy on 08/20/12 with path  1.0 cm IDC with extravasated mucin, 0/3 nodes, ER 100%, PR 100% Her2Neu neg, Ki-67 22%  #3 Oncotype Dx performed on 5/14 with recurrnece score of 13 and distant recurrence rate of 8% , in the low risk category.  #4  Patient underwent adjuvant radiation therapy from 10/01/12 through 10/31/12.    CURRENT THERAPY: anti-estrogen therapy  INTERVAL HISTORY: Sharon Nguyen 57 y.o. female returns for follow up visit overall she is doing well.  She completed radiation therapy and her skin is recovering.  She denies fevers, chills, night sweats, pain, lymphedema, or any other concerns.  A 10 point ROS is negative.   She does convey that she is flying to  [place] soon.  She has had a bone density test and cannot remember the date or the results.   MEDICAL HISTORY: Past Medical History  Diagnosis Date  . Breast cancer   . Hypertension   . Hepatitis C   . TB (pulmonary tuberculosis)   . GERD (gastroesophageal reflux disease)   . Status post radiation therapy 10/01/12-10/31/12    rt breast/4250cGy 17 sessions/ rt breat boost=750cGy /3 sessions    ALLERGIES:  is allergic to procardia.  MEDICATIONS:  Current Outpatient Prescriptions  Medication Sig Dispense Refill  . amLODipine (NORVASC) 10 MG tablet Take 10 mg by mouth daily.      . fish oil-omega-3 fatty acids 1000 MG capsule Take 1 g by mouth daily.      . hyaluronate sodium (RADIAPLEXRX) GEL Apply topically 2 (two) times daily.      Marland Kitchen losartan-hydrochlorothiazide (HYZAAR) 100-25 MG per tablet Take 1 tablet by mouth daily.      . Multiple Vitamin (MULTIVITAMIN WITH MINERALS) TABS Take 1 tablet by mouth daily.      Marland Kitchen HYDROcodone-acetaminophen (NORCO/VICODIN) 5-325 MG per tablet Take 1-2 tablets by mouth every 6 (six) hours as needed for pain.  30 tablet  1   No current facility-administered medications for this visit.    SURGICAL HISTORY:  Past Surgical History  Procedure Laterality Date  . Thyroid cyst excision    . Tubal ligation    . Mandible fracture surgery    . Breast lumpectomy with needle localization and axillary  sentinel lymph node bx Right 08/20/2012    Procedure: BREAST LUMPECTOMY WITH NEEDLE LOCALIZATION AND AXILLARY SENTINEL LYMPH NODE BX;  Surgeon: Kandis Cocking, MD;  Location: [facility] OR;  Service: General;  Laterality: Right;  . Breast surgery      REVIEW OF SYSTEMS:  Pertinent items are noted in HPI.   PHYSICAL EXAMINATION: Blood pressure 136/85, pulse 72, temperature 98.3 F (36.8 C), temperature source Oral, resp. rate 20, height 5\' 6"  (1.676 m), weight 160 lb 14.4 oz (72.984 kg). Body mass index is 25.98 kg/(m^2). General: Patient is a well appearing female in  no acute distress HEENT: PERRLA, sclerae anicteric no conjunctival pallor, MMM Neck: supple, no palpable adenopathy Lungs: clear to auscultation bilaterally, no wheezes, rhonchi, or rales Cardiovascular: regular rate rhythm, S1, S2, no murmurs, rubs or gallops Abdomen: Soft, non-tender, non-distended, normoactive bowel sounds, no HSM Extremities: warm and well perfused, no clubbing, cyanosis, or edema Skin: No rashes or lesions Neuro: Non-focal Breasts: Right lumpectomy site without nodularity, hyperpigmentation to right chest wall.  Left breast no nodules or masses. ECOG PERFORMANCE STATUS: 0 - Asymptomatic    LABORATORY DATA: Lab Results  Component Value Date   WBC 5.4 09/19/2012   HGB 13.3 09/19/2012   HCT 39.8 09/19/2012   MCV 86.6 09/19/2012   PLT 138* 09/19/2012      Chemistry      Component Value Date/Time   NA 140 09/19/2012 1106   NA 140 08/14/2012 1517   K 3.7 09/19/2012 1106   K 3.4* 08/14/2012 1517   CL 104 09/19/2012 1106   CL 101 08/14/2012 1517   CO2 27 09/19/2012 1106   CO2 28 08/14/2012 1517   BUN 13.6 09/19/2012 1106   BUN 17 08/14/2012 1517   CREATININE 0.8 09/19/2012 1106   CREATININE 1.01 08/14/2012 1517      Component Value Date/Time   CALCIUM 10.0 09/19/2012 1106   CALCIUM 10.3 08/14/2012 1517   ALKPHOS 84 09/19/2012 1106   ALKPHOS 81 08/14/2012 1517   AST 17 09/19/2012 1106   AST 22 08/14/2012 1517   ALT 17 09/19/2012 1106   ALT 15 08/14/2012 1517   BILITOT 0.64 09/19/2012 1106   BILITOT 0.3 08/14/2012 1517       RADIOGRAPHIC STUDIES:  No results found.  ASSESSMENT: 57 year old female with   1. Stage I (T1N0) IDC s/p lumpectomy and SLN.  2. Now receiving RT tolerating it well  3. Once she finishes the RT she wil go on to adjuvant anti-estrogen with an AI.  She and I discussed the use in anti-estrogen therapy along with the risks/benefits.  She will bring her bone density information to her next appointment.     PLAN:  1. Doing well.  No sign of  recurrence. I prescribed Arimidex daily. I also gave her a prescription for a lymphedema sleeve to wear while flying.  She will take Calcium and vitamin D daily and perform weight bearing exercises to keep her bones strong while taking the Arimidex.   2. She will follow up with [place] in 3 months.    All questions were answered. The patient knows to call the clinic with any problems, questions or concerns. We can certainly see the patient much sooner if necessary.  I spent 25 minutes counseling the patient face to face. The total time spent in the appointment was 30 minutes.  Cherie Ouch Lyn Hollingshead, NP Medical Oncology [facility] Phone: [phone] 12/20/2012, 12:42 PM

## 2012-12-20 NOTE — Patient Instructions (Addendum)

## 2013-02-27 ENCOUNTER — Other Ambulatory Visit: Payer: Self-pay

## 2013-03-24 ENCOUNTER — Other Ambulatory Visit (HOSPITAL_BASED_OUTPATIENT_CLINIC_OR_DEPARTMENT_OTHER): Payer: 59

## 2013-03-24 ENCOUNTER — Ambulatory Visit (HOSPITAL_BASED_OUTPATIENT_CLINIC_OR_DEPARTMENT_OTHER): Payer: 59 | Admitting: Adult Health

## 2013-03-24 ENCOUNTER — Telehealth: Payer: Self-pay | Admitting: Oncology

## 2013-03-24 ENCOUNTER — Encounter: Payer: Self-pay | Admitting: *Deleted

## 2013-03-24 ENCOUNTER — Encounter: Payer: Self-pay | Admitting: Adult Health

## 2013-03-24 VITALS — BP 132/80 | HR 88 | Temp 98.7°F | Resp 18 | Ht 66.0 in | Wt 163.3 lb

## 2013-03-24 DIAGNOSIS — E559 Vitamin D deficiency, unspecified: Secondary | ICD-10-CM

## 2013-03-24 DIAGNOSIS — Z170 Estrogen receptor positive status [ER+]: Secondary | ICD-10-CM

## 2013-03-24 DIAGNOSIS — C50511 Malignant neoplasm of lower-outer quadrant of right female breast: Secondary | ICD-10-CM

## 2013-03-24 DIAGNOSIS — C50519 Malignant neoplasm of lower-outer quadrant of unspecified female breast: Secondary | ICD-10-CM

## 2013-03-24 LAB — COMPREHENSIVE METABOLIC PANEL (CC13)
ALT: 17 U/L (ref 0–55)
AST: 17 U/L (ref 5–34)
Albumin: 4.4 g/dL (ref 3.5–5.0)
Alkaline Phosphatase: 77 U/L (ref 40–150)
Anion Gap: 9 mEq/L (ref 3–11)
BUN: 18.3 mg/dL (ref 7.0–26.0)
CO2: 27 mEq/L (ref 22–29)
Calcium: 10.4 mg/dL (ref 8.4–10.4)
Chloride: 104 mEq/L (ref 98–109)
Creatinine: 0.8 mg/dL (ref 0.6–1.1)
Glucose: 90 mg/dl (ref 70–140)
Potassium: 3.7 mEq/L (ref 3.5–5.1)
Sodium: 140 mEq/L (ref 136–145)
Total Bilirubin: 0.31 mg/dL (ref 0.20–1.20)
Total Protein: 8.6 g/dL — ABNORMAL HIGH (ref 6.4–8.3)

## 2013-03-24 LAB — CBC WITH DIFFERENTIAL/PLATELET
BASO%: 0.8 % (ref 0.0–2.0)
Basophils Absolute: 0.1 10*3/uL (ref 0.0–0.1)
EOS%: 3 % (ref 0.0–7.0)
Eosinophils Absolute: 0.2 10*3/uL (ref 0.0–0.5)
HCT: 42.2 % (ref 34.8–46.6)
HGB: 13.5 g/dL (ref 11.6–15.9)
LYMPH%: 19.6 % (ref 14.0–49.7)
MCH: 28.9 pg (ref 25.1–34.0)
MCHC: 32 g/dL (ref 31.5–36.0)
MCV: 90.3 fL (ref 79.5–101.0)
MONO#: 0.5 10*3/uL (ref 0.1–0.9)
MONO%: 7 % (ref 0.0–14.0)
NEUT#: 5.1 10*3/uL (ref 1.5–6.5)
NEUT%: 69.6 % (ref 38.4–76.8)
Platelets: 152 10*3/uL (ref 145–400)
RBC: 4.67 10*6/uL (ref 3.70–5.45)
RDW: 14.2 % (ref 11.2–14.5)
WBC: 7.3 10*3/uL (ref 3.9–10.3)
lymph#: 1.4 10*3/uL (ref 0.9–3.3)

## 2013-03-24 MED ORDER — ALPRAZOLAM 0.25 MG PO TABS: 0.2500 mg | ORAL_TABLET | Freq: Every evening | ORAL | Status: AC | PRN

## 2013-03-24 NOTE — Progress Notes (Signed)
OFFICE PROGRESS NOTE  CC  Sharon Patella, MD [address], [address] [place] [address] Dr. Ovidio Kin  Dr. Chipper Herb   DIAGNOSIS: 57 year old female with new diagnosis of invasive ductal carcinoma of the right breast (T1 N0)  STAGE:  Cancer of lower-outer quadrant of female breast  Primary site: Breast (Right)  Staging method: AJCC 7th Edition  Clinical: Stage IA (T1c, N0, cM0)  Summary: Stage IA (T1c, N0, cM0)   PRIOR THERAPY: #1On 04/30/2012 patient had a short interval followup of a nodule along the right lower quadrant of the posterior right breast. Interval nodule was seen again along the right lower quadrant. She had an ultrasound done that showed a cyst at the 6:00 position and a 4 x 3 x 4 mm hypoechoic nodule at the 8:00 position 8 cm from the nipple. This was felt to be smaller than a nodule noted in the mammogram and did not correlate to the mammographic findings  #2 Patient had a stereotactic biopsy performed on 06/10/2012. The pathology showed a invasive mammary carcinoma with extravasated mucin. Tumor was low grade ER positive PR +100% with a proliferation marker Ki-67 of 22%.  #3 s/p right needle loc lumpectomy with right axillary sentinel lymph node biopsy on 08/20/12 with path  1.0 cm IDC with extravasated mucin, 0/3 nodes, ER 100%, PR 100% Her2Neu neg, Ki-67 22%  #3 Oncotype Dx performed on 5/14 with recurrnece score of 13 and distant recurrence rate of 8% , in the low risk category.  #4  Patient underwent adjuvant radiation therapy from 10/01/12 through 10/31/12.    #5 Adjuvant Arimidex starting 12/20/12  CURRENT THERAPY: Arimidex daily  INTERVAL HISTORY: Sharon Nguyen 57 y.o. female returns for evaluation following starting daily Arimidex.  She is doing well today.  She is tolerating the Arimidex well.  She denies hot flashes, dryness, joint pain.  She is leaving to go to [place] in a couple of weeks.  She has her lymphedema sleeve to fly.   She would like some medication for anxiety just in case while she is over there.  She has never needed it before, but is fearful she may need it and have no physician to get care from over there. Otherwise, she is well and a 10 point ROS is neg.   MEDICAL HISTORY: Past Medical History  Diagnosis Date  . Breast cancer   . Hypertension   . Hepatitis C   . TB (pulmonary tuberculosis)   . GERD (gastroesophageal reflux disease)   . Status post radiation therapy 10/01/12-10/31/12    rt breast/4250cGy 17 sessions/ rt breat boost=750cGy /3 sessions    ALLERGIES:  is allergic to procardia.  MEDICATIONS:  Current Outpatient Prescriptions  Medication Sig Dispense Refill  . amLODipine (NORVASC) 10 MG tablet Take 10 mg by mouth daily.      Marland Kitchen anastrozole (ARIMIDEX) 1 MG tablet Take 1 tablet (1 mg total) by mouth daily.  30 tablet  3  . fish oil-omega-3 fatty acids 1000 MG capsule Take 1 g by mouth daily.      Marland Kitchen losartan-hydrochlorothiazide (HYZAAR) 100-25 MG per tablet Take 1 tablet by mouth daily.      . Multiple Vitamin (MULTIVITAMIN WITH MINERALS) TABS Take 1 tablet by mouth daily.      Marland Kitchen ALPRAZolam (XANAX) 0.25 MG tablet Take 1 tablet (0.25 mg total) by mouth at bedtime as needed for anxiety.  15 tablet  0  . HYDROcodone-acetaminophen (NORCO/VICODIN) 5-325 MG per tablet Take 1-2 tablets  by mouth every 6 (six) hours as needed for pain.  30 tablet  1   No current facility-administered medications for this visit.    SURGICAL HISTORY:  Past Surgical History  Procedure Laterality Date  . Thyroid cyst excision    . Tubal ligation    . Mandible fracture surgery    . Breast lumpectomy with needle localization and axillary sentinel lymph node bx Right 08/20/2012    Procedure: BREAST LUMPECTOMY WITH NEEDLE LOCALIZATION AND AXILLARY SENTINEL LYMPH NODE BX;  Surgeon: Kandis Cocking, MD;  Location: [facility] OR;  Service: General;  Laterality: Right;  . Breast surgery      REVIEW OF SYSTEMS:  A 10 point  review of systems was conducted and is otherwise negative except for what is noted above.    Health Maintenance  Mammogram:  04/30/2012 Colonoscopy: 2011 Bone Density Scan: unknown Pap Smear: 02/2011 Eye Exam: 12/2012 Vitamin D Level: due today Lipid Panel: 2013   PHYSICAL EXAMINATION: Blood pressure 132/80, pulse 88, temperature 98.7 F (37.1 C), temperature source Oral, resp. rate 18, height 5\' 6"  (1.676 m), weight 163 lb 4.8 oz (74.072 kg). Body mass index is 26.37 kg/(m^2). General: Patient is a well appearing female in no acute distress HEENT: PERRLA, sclerae anicteric no conjunctival pallor, MMM Neck: supple, no palpable adenopathy Lungs: clear to auscultation bilaterally, no wheezes, rhonchi, or rales Cardiovascular: regular rate rhythm, S1, S2, no murmurs, rubs or gallops Abdomen: Soft, non-tender, non-distended, normoactive bowel sounds, no HSM Extremities: warm and well perfused, no clubbing, cyanosis, or edema Skin: No rashes or lesions Neuro: Non-focal Breasts: Right lumpectomy site without nodularity, hyperpigmentation to right chest wall.  Left breast no nodules or masses. ECOG PERFORMANCE STATUS: 0 - Asymptomatic    LABORATORY DATA: Lab Results  Component Value Date   WBC 5.4 09/19/2012   HGB 13.3 09/19/2012   HCT 39.8 09/19/2012   MCV 86.6 09/19/2012   PLT 138* 09/19/2012      Chemistry      Component Value Date/Time   NA 140 09/19/2012 1106   NA 140 08/14/2012 1517   K 3.7 09/19/2012 1106   K 3.4* 08/14/2012 1517   CL 104 09/19/2012 1106   CL 101 08/14/2012 1517   CO2 27 09/19/2012 1106   CO2 28 08/14/2012 1517   BUN 13.6 09/19/2012 1106   BUN 17 08/14/2012 1517   CREATININE 0.8 09/19/2012 1106   CREATININE 1.01 08/14/2012 1517      Component Value Date/Time   CALCIUM 10.0 09/19/2012 1106   CALCIUM 10.3 08/14/2012 1517   ALKPHOS 84 09/19/2012 1106   ALKPHOS 81 08/14/2012 1517   AST 17 09/19/2012 1106   AST 22 08/14/2012 1517   ALT 17 09/19/2012 1106   ALT 15  08/14/2012 1517   BILITOT 0.64 09/19/2012 1106   BILITOT 0.3 08/14/2012 1517       RADIOGRAPHIC STUDIES:  No results found.  ASSESSMENT: 57 year old female with   1. Stage I (T1N0) IDC s/p lumpectomy and SLN.  2. Patient completed adjuvant radiation therapy from 10/01/12 through 10/31/12.    3. Patient started daily Arimidex on 12/20/12.    PLAN:  1. Patient is doing well.  She has no sign of recurrence.  She will continue Arimidex daily.  I ordered a bone density today.  I prescribed Xanax #15 for her to take if she becomes anxious and needs it.   2.  She will continue her calcium and vitamin d intake, we discussed  survivorship and health maintenance.    3.  She will return in 6 months for labs and evaluation.   All questions were answered. The patient knows to call the clinic with any problems, questions or concerns. We can certainly see the patient much sooner if necessary.  I spent 25 minutes counseling the patient face to face. The total time spent in the appointment was 30 minutes.  Illa Level, NP Medical Oncology [facility] [phone] 03/24/2013, 12:42 PM

## 2013-03-24 NOTE — Telephone Encounter (Signed)
, °

## 2013-03-24 NOTE — Patient Instructions (Signed)
Alprazolam tablets What is this medicine? ALPRAZOLAM (al PRAY zoe lam) is a benzodiazepine. It is used to treat anxiety and panic attacks. This medicine may be used for other purposes; ask your health care provider or pharmacist if you have questions. COMMON BRAND NAME(S): Xanax What should I tell my health care provider before I take this medicine? They need to know if you have any of these conditions: -an alcohol or drug abuse problem -bipolar disorder, depression, psychosis or other mental health conditions -glaucoma -kidney or liver disease -lung or breathing disease -myasthenia gravis -Parkinson's disease -porphyria -seizures or a history of seizures -suicidal thoughts -an unusual or allergic reaction to alprazolam, other benzodiazepines, foods, dyes, or preservatives -pregnant or trying to get pregnant -breast-feeding How should I use this medicine? Take this medicine by mouth with a glass of water. Follow the directions on the prescription label. Take your medicine at regular intervals. Do not take it more often than directed. If you have been taking this medicine regularly for some time, do not suddenly stop taking it. You must gradually reduce the dose or you may get severe side effects. Ask your doctor or health care professional for advice. Even after you stop taking this medicine it can still affect your body for several days. Talk to your pediatrician regarding the use of this medicine in children. Special care may be needed. Overdosage: If you think you have taken too much of this medicine contact a poison control center or emergency room at once. NOTE: This medicine is only for you. Do not share this medicine with others. What if I miss a dose? If you miss a dose, take it as soon as you can. If it is almost time for your next dose, take only that dose. Do not take double or extra doses. What may interact with this medicine? Do not take this medicine with any of the  following medications: -certain medicines for HIV infection or AIDS -ketoconazole -itraconazole This medicine may also interact with the following medications: -birth control pills -certain macrolide antibiotics like clarithromycin, erythromycin, troleandomycin -cimetidine -cyclosporine -ergotamine -grapefruit juice -herbal or dietary supplements like kava kava, melatonin, dehydroepiandrosterone, DHEA, St. John's Wort or valerian -imatinib, STI-571 -isoniazid -levodopa -medicines for depression, anxiety, or psychotic disturbances -prescription pain medicines -rifampin, rifapentine, or rifabutin -some medicines for blood pressure or heart problems -some medicines for seizures like carbamazepine, oxcarbazepine, phenobarbital, phenytoin, primidone This list may not describe all possible interactions. Give your health care provider a list of all the medicines, herbs, non-prescription drugs, or dietary supplements you use. Also tell them if you smoke, drink alcohol, or use illegal drugs. Some items may interact with your medicine. What should I watch for while using this medicine? Visit your doctor or health care professional for regular checks on your progress. Your body can become dependent on this medicine. Ask your doctor or health care professional if you still need to take it. You may get drowsy or dizzy. Do not drive, use machinery, or do anything that needs mental alertness until you know how this medicine affects you. To reduce the risk of dizzy and fainting spells, do not stand or sit up quickly, especially if you are an older patient. Alcohol may increase dizziness and drowsiness. Avoid alcoholic drinks. Do not treat yourself for coughs, colds or allergies without asking your doctor or health care professional for advice. Some ingredients can increase possible side effects. What side effects may I notice from receiving this medicine? Side effects that you  should report to your doctor  or health care professional as soon as possible: -allergic reactions like skin rash, itching or hives, swelling of the face, lips, or tongue -confusion, forgetfulness -depression -difficulty sleeping -difficulty speaking -feeling faint or lightheaded, falls -mood changes, excitability or aggressive behavior -muscle cramps -trouble passing urine or change in the amount of urine -unusually weak or tired Side effects that usually do not require medical attention (report to your doctor or health care professional if they continue or are bothersome): -change in sex drive or performance -changes in appetite This list may not describe all possible side effects. Call your doctor for medical advice about side effects. You may report side effects to FDA at 1-800-FDA-1088. Where should I keep my medicine? Keep out of the reach of children. This medicine can be abused. Keep your medicine in a safe place to protect it from theft. Do not share this medicine with anyone. Selling or giving away this medicine is dangerous and against the law. Store at room temperature between 20 and 25 degrees C (68 and 77 degrees F). Throw away any unused medicine after the expiration date. NOTE: This sheet is a summary. It may not cover all possible information. If you have questions about this medicine, talk to your doctor, pharmacist, or health care provider.  2014, Elsevier/Gold Standard. (2007-07-04 10:34:46)

## 2013-03-24 NOTE — Progress Notes (Signed)
Patient at [facility] for follow up visit with Larna Daughters, NP.  Patient reports that she is doing fairly well.  She continues to take Arimidex daily, is tolerating it well and does not report any side effects.  She does say that she has an area of tenderness in her right breast at the site of her surgery which has her concerned.  She denies feeling any mass in the area.  We discussed that it may be continued healing from surgery but to make sure Mardella Layman is aware when she completes her breast exam.  Patient's husband passed away this past spring after she cared for him for 2 years and during her diagnosis and treatment.  She talked about her loss more today than in previous visits and we talked about how this Thanksgiving was the first holiday without him.  Patient states that she feels she has been able to cope well but different events trigger her emotions such a coming to [facility].  Patient is planning a trip to [place] in December and she shared that her sister-in-law suggested she ask for something for depression so that she might be prepared while on her trip should she find the Christmas holiday difficult.  Patient states that she does not like to take medications.  I encouraged her to discuss with Mardella Layman and that she might want to have something to take only if needed.  Ms. Mandile denied any other questions or concerns at this time.  I encouraged her to call me for any needs.

## 2013-03-25 LAB — VITAMIN D 25 HYDROXY (VIT D DEFICIENCY, FRACTURES): Vit D, 25-Hydroxy: 33 ng/mL (ref 30–89)

## 2013-03-28 ENCOUNTER — Ambulatory Visit (INDEPENDENT_AMBULATORY_CARE_PROVIDER_SITE_OTHER): Payer: 59 | Admitting: Surgery

## 2013-05-07 ENCOUNTER — Ambulatory Visit (INDEPENDENT_AMBULATORY_CARE_PROVIDER_SITE_OTHER): Payer: 59 | Admitting: Surgery

## 2013-05-07 NOTE — Progress Notes (Signed)
No show

## 2013-05-15 ENCOUNTER — Ambulatory Visit (INDEPENDENT_AMBULATORY_CARE_PROVIDER_SITE_OTHER): Payer: 59 | Admitting: Surgery

## 2013-06-24 ENCOUNTER — Other Ambulatory Visit: Payer: Self-pay | Admitting: Adult Health

## 2013-07-11 ENCOUNTER — Encounter (INDEPENDENT_AMBULATORY_CARE_PROVIDER_SITE_OTHER): Payer: Self-pay | Admitting: Surgery

## 2013-07-11 ENCOUNTER — Ambulatory Visit (INDEPENDENT_AMBULATORY_CARE_PROVIDER_SITE_OTHER): Payer: 59 | Admitting: Surgery

## 2013-07-11 VITALS — BP 102/82 | HR 82 | Temp 97.4°F | Resp 14 | Ht 68.0 in | Wt 165.4 lb

## 2013-07-11 DIAGNOSIS — C50519 Malignant neoplasm of lower-outer quadrant of unspecified female breast: Secondary | ICD-10-CM

## 2013-07-11 NOTE — Progress Notes (Addendum)
Re:   Sharon Nguyen DOB:   10-14-1955 MRN:   [id number]  Breast [facility]DC  ASSESSMENT AND PLAN: 1.  Right Breast Cancer, 7 o'clock. 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 Valere Dross and [employer].  Lumpectomy (needle loc) and right axillary sentinel lymph node biopsy - 08/20/2012  Oncotype recurrence score is 13.  Recurrence rate 8%  She is on anastrozole and doing well with this.  She is disease free  She'll see me back in 6 months.  2.  Hypertension 3.  Hepatitis C - treated through [facility] - May 2011  She says she is disease free 4.  Treated for TB off lymph node biopsy - 2004/10/29 5.  Husband died from "bile duct cancer" - Jul 30, 2012  Chief Complaint  Patient presents with  . Breast Cancer Long Term Follow Up    LTFU/6 month br   REFERRING PHYSICIAN: [name], MD  HISTORY OF PRESENT ILLNESS: Sharon Nguyen is a 58 y.o. (DOB: 26-Sep-1955)  AA  female whose primary care physician is Sharon Austria, MD and comes for follow up of a right breast lumpectomy and right AXLN Bx. I have not seen her since early after surgery.  She did well with radiation tx.  She is now on anastrozole.  She is doing well.   She had some questions about follow up and the risk of recurrence.  I reviewed with her oncotype score, which is very favorable.  She is on an anti-estrogen, which lowers her risk of recurrence even lower.  I stressed follow up with physical exams and mammograms.  She also talked a little about retirement.  She hopes to do that in the next several months.  Breast Cancer History: The patient has had routine mammography. In fact about a year ago, to get her mammograms more frequently. She had a mammogram on 04/30/2012 showed an ill-defined nodule in the right lower outer quadrant. She had a biopsy on 06/10/2012 showed an invasive mammary carcinoma. There is no indication for an MRI. Her last  period was around Jul 30, 2001 when she was 58 years old. She is not on hormone medication. She's had no prior history of breast disease or breast biopsy. Her husband died from bile duct cancer in 2012/07/30.   Past Medical History  Diagnosis Date  . Hypertension   . Hepatitis C   . TB (pulmonary tuberculosis)   . GERD (gastroesophageal reflux disease)   . Status post radiation therapy 10/01/12-10/31/12    rt breast/4250cGy 17 sessions/ rt breat boost=750cGy /3 sessions  . Breast cancer     right 07/30/2012     Current Outpatient Prescriptions  Medication Sig Dispense Refill  . ALPRAZolam (XANAX) 0.25 MG tablet Take 1 tablet (0.25 mg total) by mouth at bedtime as needed for anxiety.  15 tablet  0  . amLODipine (NORVASC) 10 MG tablet Take 10 mg by mouth daily.      Marland Kitchen anastrozole (ARIMIDEX) 1 MG tablet TAKE 1 TABLET BY MOUTH DAILY.  30 tablet  3  . fish oil-omega-3 fatty acids 1000 MG capsule Take 1 g by mouth daily.      Marland Kitchen losartan-hydrochlorothiazide (HYZAAR) 100-25 MG per tablet Take 1 tablet by mouth daily.      . Multiple Vitamin (MULTIVITAMIN WITH MINERALS) TABS Take 1 tablet by mouth daily.  No current facility-administered medications for this visit.     Allergies  Allergen Reactions  . Procardia [Nifedipine]     headaches    REVIEW OF SYSTEMS: Skin:  No history of rash.   Infection: History of TB and Hep C.  She was treated at [facility] for the Hep C, but does not remember the physician's name. Neurologic:  No history of stroke.  No history of seizure.  No history of headaches. Cardiac:  Hypertension x 20 years. No history of heart disease.  No history of prior cardiac catheterization.  No history of seeing a cardiologist. Gastrointestinal:  No history of stomach disease.  No history of liver disease.  No history of gall bladder disease.  No history of pancreas disease.  Colonoscopy in 2012 - she can't remember the physician.  SOCIAL and FAMILY HISTORY: Husband died from "bile duct  cancer" - April 2014 She has 4 sons, 2 in [place], 1 in [place], and 1 in [place].  Her children do not know about her diagnosis. She works as a IT consultant at [employer]. She is hoping to retire in the next year.  PHYSICAL EXAM: BP 102/82  Pulse 82  Temp(Src) 97.4 F (36.3 C) (Temporal)  Resp 14  Ht 5\' 8"  (1.727 m)  Wt 165 lb 6.4 oz (75.025 kg)  BMI 25.15 kg/m2  General: WN thinner AA F who is alert and generally healthy appearing.  HEENT: Normal. Pupils equal. Neck: Supple. No mass.  No thyroid mass.  She has a scar over the medial right clavicle, where she had the biopsy that showed TB in 2006. Breasts:  Right -  Lower outer quadrant incision okay.  Right axillary incision okay.  She has some tenderness around her nipple and right axilla.  She has some pigmentation of the right breast and changes that go along with radiation tx.  Left - No mass.  No nipple discharge.  DATA REVIEWED: No new data  Sharon Overall, MD,  [facility], [address]  [place], [place]    [place] Phone:  [phone] FAX:  [phone]

## 2013-07-28 ENCOUNTER — Encounter: Payer: Self-pay | Admitting: *Deleted

## 2013-07-28 NOTE — [facility] Oncology Navigator Note (Signed)
I called patient to check in.  I left a voice mail message with my contact information and request that she return my call.

## 2013-09-22 ENCOUNTER — Other Ambulatory Visit: Payer: 59

## 2013-09-22 ENCOUNTER — Ambulatory Visit: Payer: 59 | Admitting: Adult Health

## 2013-10-03 ENCOUNTER — Encounter: Payer: Self-pay | Admitting: *Deleted

## 2013-10-03 NOTE — [facility] Oncology Navigator Note (Signed)
I called patient to check in.  I left a voicemail message with contact information and request that she return my call. 

## 2013-10-10 ENCOUNTER — Other Ambulatory Visit: Payer: Self-pay | Admitting: Adult Health

## 2013-10-10 ENCOUNTER — Other Ambulatory Visit: Payer: Self-pay

## 2013-10-10 DIAGNOSIS — Z853 Personal history of malignant neoplasm of breast: Secondary | ICD-10-CM

## 2013-10-20 ENCOUNTER — Ambulatory Visit
Admission: RE | Admit: 2013-10-20 | Discharge: 2013-10-20 | Disposition: A | Discharge status: Home / Self Care | Payer: 59 | Source: Ambulatory Visit | Attending: Adult Health | Admitting: Adult Health

## 2013-10-20 DIAGNOSIS — Z853 Personal history of malignant neoplasm of breast: Secondary | ICD-10-CM

## 2013-11-12 ENCOUNTER — Telehealth: Payer: Self-pay | Admitting: Adult Health

## 2013-11-12 ENCOUNTER — Telehealth: Payer: Self-pay | Admitting: *Deleted

## 2013-11-12 DIAGNOSIS — C50519 Malignant neoplasm of lower-outer quadrant of unspecified female breast: Secondary | ICD-10-CM

## 2013-11-12 MED ORDER — ANASTROZOLE 1 MG PO TABS: ORAL_TABLET | ORAL | Status: DC

## 2013-11-12 NOTE — Telephone Encounter (Signed)
Received faxed request for refill on anatrozole.  Noted that patient has no future appts.  She saw Charlestine Massed NP on 03/24/13.  Patient cancelled visit on 09/22/13 and no additional appts. Have been made.  Called patient and left message on cell and home phone that we will fill this for one time, but she needs to contact us for appt.  Also POF done.  Also VM left for Elizebeth Koller RN for [employer].

## 2013-11-12 NOTE — Telephone Encounter (Signed)
lmonvm advising the pt of her appts that she missed in June and the appt d/t with the new scheduled appts.

## 2013-11-13 ENCOUNTER — Telehealth: Payer: Self-pay | Admitting: Hematology

## 2013-11-13 NOTE — Telephone Encounter (Signed)
per pt cld and wanted to r/s appt-will be out of town 8/3. r/s for 7/27 w/cover 2-pt aware of time & date

## 2013-11-17 ENCOUNTER — Encounter: Payer: Self-pay | Admitting: Hematology

## 2013-11-17 ENCOUNTER — Ambulatory Visit (HOSPITAL_BASED_OUTPATIENT_CLINIC_OR_DEPARTMENT_OTHER): Payer: 59 | Admitting: Hematology

## 2013-11-17 ENCOUNTER — Other Ambulatory Visit: Payer: Self-pay | Admitting: *Deleted

## 2013-11-17 ENCOUNTER — Other Ambulatory Visit (HOSPITAL_BASED_OUTPATIENT_CLINIC_OR_DEPARTMENT_OTHER): Payer: 59

## 2013-11-17 ENCOUNTER — Encounter: Payer: Self-pay | Admitting: *Deleted

## 2013-11-17 VITALS — BP 122/71 | HR 76 | Temp 98.6°F | Resp 18 | Ht 68.0 in | Wt 164.3 lb

## 2013-11-17 DIAGNOSIS — C50919 Malignant neoplasm of unspecified site of unspecified female breast: Secondary | ICD-10-CM

## 2013-11-17 DIAGNOSIS — C50519 Malignant neoplasm of lower-outer quadrant of unspecified female breast: Secondary | ICD-10-CM

## 2013-11-17 DIAGNOSIS — Z853 Personal history of malignant neoplasm of breast: Secondary | ICD-10-CM

## 2013-11-17 DIAGNOSIS — N959 Unspecified menopausal and perimenopausal disorder: Secondary | ICD-10-CM

## 2013-11-17 DIAGNOSIS — Z79811 Long term (current) use of aromatase inhibitors: Secondary | ICD-10-CM

## 2013-11-17 DIAGNOSIS — C50511 Malignant neoplasm of lower-outer quadrant of right female breast: Secondary | ICD-10-CM

## 2013-11-17 LAB — COMPREHENSIVE METABOLIC PANEL (CC13)
ALT: 15 U/L (ref 0–55)
AST: 17 U/L (ref 5–34)
Albumin: 4.3 g/dL (ref 3.5–5.0)
Alkaline Phosphatase: 81 U/L (ref 40–150)
Anion Gap: 8 mEq/L (ref 3–11)
BUN: 13.2 mg/dL (ref 7.0–26.0)
CO2: 29 mEq/L (ref 22–29)
Calcium: 10.1 mg/dL (ref 8.4–10.4)
Chloride: 105 mEq/L (ref 98–109)
Creatinine: 0.8 mg/dL (ref 0.6–1.1)
Glucose: 93 mg/dl (ref 70–140)
Potassium: 3.8 mEq/L (ref 3.5–5.1)
Sodium: 142 mEq/L (ref 136–145)
Total Bilirubin: 0.56 mg/dL (ref 0.20–1.20)
Total Protein: 7.8 g/dL (ref 6.4–8.3)

## 2013-11-17 LAB — CBC WITH DIFFERENTIAL/PLATELET
BASO%: 0.6 % (ref 0.0–2.0)
Basophils Absolute: 0 10*3/uL (ref 0.0–0.1)
EOS%: 1.7 % (ref 0.0–7.0)
Eosinophils Absolute: 0.1 10*3/uL (ref 0.0–0.5)
HCT: 40.3 % (ref 34.8–46.6)
HGB: 12.9 g/dL (ref 11.6–15.9)
LYMPH%: 23.2 % (ref 14.0–49.7)
MCH: 28.3 pg (ref 25.1–34.0)
MCHC: 32 g/dL (ref 31.5–36.0)
MCV: 88.4 fL (ref 79.5–101.0)
MONO#: 0.5 10*3/uL (ref 0.1–0.9)
MONO%: 7.8 % (ref 0.0–14.0)
NEUT#: 4.1 10*3/uL (ref 1.5–6.5)
NEUT%: 66.7 % (ref 38.4–76.8)
Platelets: 160 10*3/uL (ref 145–400)
RBC: 4.55 10*6/uL (ref 3.70–5.45)
RDW: 13.4 % (ref 11.2–14.5)
WBC: 6.1 10*3/uL (ref 3.9–10.3)
lymph#: 1.4 10*3/uL (ref 0.9–3.3)

## 2013-11-17 MED ORDER — ANASTROZOLE 1 MG PO TABS: ORAL_TABLET | ORAL | Status: DC

## 2013-11-17 NOTE — Progress Notes (Signed)
OFFICE PROGRESS NOTE  CC  [name], MD [place] [address] [place] [address] Dr. Alphonsa Overall  Dr. 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  STAGE:  Cancer of lower-outer quadrant of female breast  Primary site: Breast (Right)  Staging method: AJCC 7th Edition  Clinical: Stage IA (T1c, N0, cM0)  Summary: Stage IA (T1c, N0, cM0)   PRIOR THERAPY: #1On 04/30/2012 patient had a short interval followup of a nodule along the right lower quadrant of the posterior right breast. Interval nodule was seen again along the right lower quadrant. She had an ultrasound done that showed a cyst at the 6:00 position and a 4 x 3 x 4 mm hypoechoic nodule at the 8:00 position 8 cm from the nipple. This was felt to be smaller than a nodule noted in the mammogram and did not correlate to the mammographic findings  #2 Patient had a stereotactic biopsy performed on 06/10/2012. The pathology showed a invasive mammary carcinoma with extravasated mucin. Tumor was low grade, ER positive PR +100% with a proliferation marker Ki-67 of 22%.  #3 s/p right needle loc lumpectomy with right axillary sentinel lymph node biopsy on 08/20/12 with path  1.0 cm IDC with extravasated mucin, 0/3 nodes, ER 100%, PR 100% Her2Neu neg, Ki-67 22%  #3 Oncotype Dx performed on 5/14 with recurrnece score of 13 and distant recurrence rate of 8% , in the low risk category.  #4  Patient underwent adjuvant radiation therapy from 10/01/12 through 10/31/12.    #5 Adjuvant Arimidex starting 12/20/12  CURRENT THERAPY: Arimidex daily  INTERVAL HISTORY: Sharon Nguyen 58 y.o. female returns for evaluation following Arimidex use for almost a year.  She is doing well today.  She is tolerating the Arimidex well.  She got a refill on her Arimidex today. Her husband passed away last year with a diagnosis of cholangiocarcinoma. She's been  little depressed. She has 4 sons and 7 granddaughter's all of them live close by. She does not perform a breast exam on a monthly basis.  She denies hot flashes, dryness, joint pain. Patient was ordered a bone density test but she did not get that done therefore I am rescheduling it to be performed in August 2015. Otherwise, she is well and a 10 point ROS is neg.   MEDICAL HISTORY: Past Medical History  Diagnosis Date  . Hypertension   . Hepatitis C   . TB (pulmonary tuberculosis)   . GERD (gastroesophageal reflux disease)   . Status post radiation therapy 10/01/12-10/31/12    rt breast/4250cGy 17 sessions/ rt breat boost=750cGy /3 sessions  . Breast cancer     right 2014    ALLERGIES:  is allergic to procardia.  MEDICATIONS:  Current Outpatient Prescriptions  Medication Sig Dispense Refill  . ALPRAZolam (XANAX) 0.25 MG tablet Take 1 tablet (0.25 mg total) by mouth at bedtime as needed for anxiety.  15 tablet  0  . amLODipine (NORVASC) 10 MG tablet Take 10 mg by mouth daily.      . fish oil-omega-3 fatty acids 1000 MG capsule Take 1 g by mouth daily.      Marland Kitchen losartan-hydrochlorothiazide (HYZAAR) 100-25 MG per tablet Take 1 tablet by mouth daily.      . Multiple Vitamin (MULTIVITAMIN WITH MINERALS) TABS Take 1 tablet by mouth daily.      Marland Kitchen anastrozole (ARIMIDEX) 1 MG tablet TAKE 1 TABLET BY  MOUTH DAILY.  90 tablet  3   No current facility-administered medications for this visit.    SURGICAL HISTORY:  Past Surgical History  Procedure Laterality Date  . Thyroid cyst excision    . Tubal ligation    . Mandible fracture surgery    . Breast lumpectomy with needle localization and axillary sentinel lymph node bx Right 08/20/2012    Procedure: BREAST LUMPECTOMY WITH NEEDLE LOCALIZATION AND AXILLARY SENTINEL LYMPH NODE BX;  Surgeon: Shann Medal, MD;  Location: [place];  Service: General;  Laterality: Right;  . Breast surgery      REVIEW OF SYSTEMS:  A 10 point review of systems was  conducted and is otherwise negative except for what is noted above.    Health Maintenance  Mammogram:  10/20/2013 Colonoscopy: 2011 Bone Density Scan: baseline I am setting up in August 2015. Pap Smear: 02/2011 Eye Exam: 12/2012 Vitamin D Level: done Lipid Panel: 2013   PHYSICAL EXAMINATION: Blood pressure 122/71, pulse 76, temperature 98.6 F (37 C), temperature source Oral, resp. rate 18, height $RemoveBe'5\' 8"'eFBlpbAsg$  (1.727 m), weight 164 lb 4.8 oz (74.526 kg). Body mass index is 24.99 kg/(m^2). General: Patient is a well appearing female in no acute distress HEENT: PERRLA, sclerae anicteric no conjunctival pallor, MMM Neck: supple, no palpable adenopathy Lungs: clear to auscultation bilaterally, no wheezes, rhonchi, or rales Cardiovascular: regular rate rhythm, S1, S2, no murmurs, rubs or gallops Abdomen: Soft, non-tender, non-distended, normoactive bowel sounds, no HSM Extremities: warm and well perfused, no clubbing, cyanosis, or edema Skin: No rashes or lesions Neuro: Non-focal Breasts: Right lumpectomy site without nodularity, hyperpigmentation to right chest wall.  Left breast no nodules or masses. No adenopathy in either axilla and no cervical or supraclavicular adenopathy noted. ECOG PERFORMANCE STATUS: 0 - Asymptomatic    LABORATORY DATA: Lab Results  Component Value Date   WBC 6.1 11/17/2013   HGB 12.9 11/17/2013   HCT 40.3 11/17/2013   MCV 88.4 11/17/2013   PLT 160 11/17/2013      Chemistry      Component Value Date/Time   NA 142 11/17/2013 1327   NA 140 08/14/2012 1517   K 3.8 11/17/2013 1327   K 3.4* 08/14/2012 1517   CL 104 09/19/2012 1106   CL 101 08/14/2012 1517   CO2 29 11/17/2013 1327   CO2 28 08/14/2012 1517   BUN 13.2 11/17/2013 1327   BUN 17 08/14/2012 1517   CREATININE 0.8 11/17/2013 1327   CREATININE 1.01 08/14/2012 1517      Component Value Date/Time   CALCIUM 10.1 11/17/2013 1327   CALCIUM 10.3 08/14/2012 1517   ALKPHOS 81 11/17/2013 1327   ALKPHOS 81 08/14/2012 1517    AST 17 11/17/2013 1327   AST 22 08/14/2012 1517   ALT 15 11/17/2013 1327   ALT 15 08/14/2012 1517   BILITOT 0.56 11/17/2013 1327   BILITOT 0.3 08/14/2012 1517       RADIOGRAPHIC STUDIES:  Mammogram 10/20/2013 BIRADS category 2 benign mammogram.  ASSESSMENT: 58 year old female with   1. Stage I (T1N0) IDC s/p lumpectomy April 2014 with Alphonsa Overall and SLN.  2. Patient completed adjuvant radiation therapy from 10/01/12 through 10/31/12.    3. Patient started daily Arimidex on  12/20/12.  She is tolerating well and showing compliance to it.  PLAN:   1. Patient is doing well.  She has no sign of recurrence and her labs and physical exam was normal.  She will continue Arimidex daily.  I ordered a bone density today to be done in August 2015 which will serve as baseline.  Arimidex to continue through August 2019.  2.  She will continue her calcium and vitamin D intake, we discussed survivorship and health maintenance.    3.  She will return in 6 months for labs and evaluation.   4. While on Arimidex, we recommend checking her lipid panel twice a year as there is 20% chance of increase in her cholesterol.  All questions were answered. The patient knows to call the clinic with any problems, questions or concerns. We can certainly see the patient much sooner if necessary.  I spent 25 minutes counseling the patient face to face. The total time spent in the appointment was 30 minutes.  Bernadene Bell, MD Medical Hematologist/Oncologist [place] Pager: [phone] Office No: [phone] 11/17/2013, 2:43 PM

## 2013-11-17 NOTE — [facility] Oncology Navigator Note (Signed)
Patient at [facility] for f/u appointment.  She reports that she is doing well.  She has retired recently and is planning a visit to [place] to visit her mother.  She shared that she felt a little emotional coming to the [place] both as a reminder of her cancer but also of her husband who was a patient cared for here too.  Patient denied any complaints, questions or concerns at this time.  I encouraged her to call me for any needs.

## 2013-11-24 ENCOUNTER — Telehealth: Payer: Self-pay | Admitting: Adult Health

## 2013-11-24 ENCOUNTER — Other Ambulatory Visit: Payer: 59

## 2013-11-24 ENCOUNTER — Ambulatory Visit: Payer: 59 | Admitting: Adult Health

## 2013-11-25 ENCOUNTER — Telehealth: Payer: Self-pay | Admitting: Adult Health

## 2013-12-01 ENCOUNTER — Other Ambulatory Visit: Payer: 59

## 2013-12-30 ENCOUNTER — Ambulatory Visit
Admission: RE | Admit: 2013-12-30 | Discharge: 2013-12-30 | Disposition: A | Discharge status: Home / Self Care | Payer: 59 | Source: Ambulatory Visit | Attending: Hematology | Admitting: Hematology

## 2013-12-30 ENCOUNTER — Other Ambulatory Visit: Payer: 59

## 2013-12-30 DIAGNOSIS — N959 Unspecified menopausal and perimenopausal disorder: Secondary | ICD-10-CM

## 2014-01-05 ENCOUNTER — Other Ambulatory Visit: Payer: Self-pay | Admitting: Adult Health

## 2014-03-17 ENCOUNTER — Encounter: Payer: Self-pay | Admitting: *Deleted

## 2014-03-17 NOTE — [facility] Oncology Navigator Note (Signed)
I called patient to check in.  Patient reports that she is doing well.  She denies any questions or concerns at this time.  We reviewed her next appointments.  I encouraged her to call me for any needs she may have.

## 2014-03-18 ENCOUNTER — Telehealth: Payer: Self-pay | Admitting: Hematology

## 2014-03-18 NOTE — Telephone Encounter (Signed)
, °

## 2014-05-04 IMAGING — MG MM BREAST WIRE LOCALIZATION*R*
6 series · 6 of 6 positions shown · non-contrast
Comparison: Previous exams.

CLINICAL DATA: Biopsy-proven invasive mammary carcinoma with
mucin, right breast biopsy performed on 06/11/2012.

NEEDLE LOCALIZATION WITH MAMMOGRAPHIC GUIDANCE AND SPECIMEN
RADIOGRAPH

[R CC (1 of 2)]
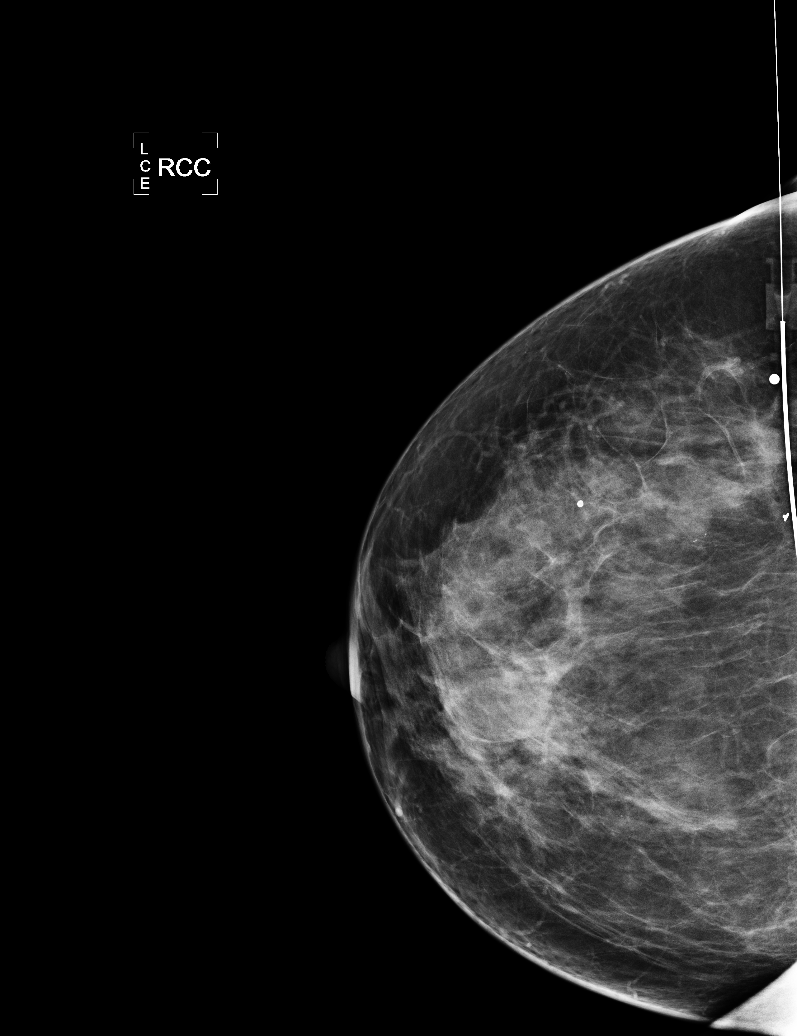

[R LM (1 of 3)]
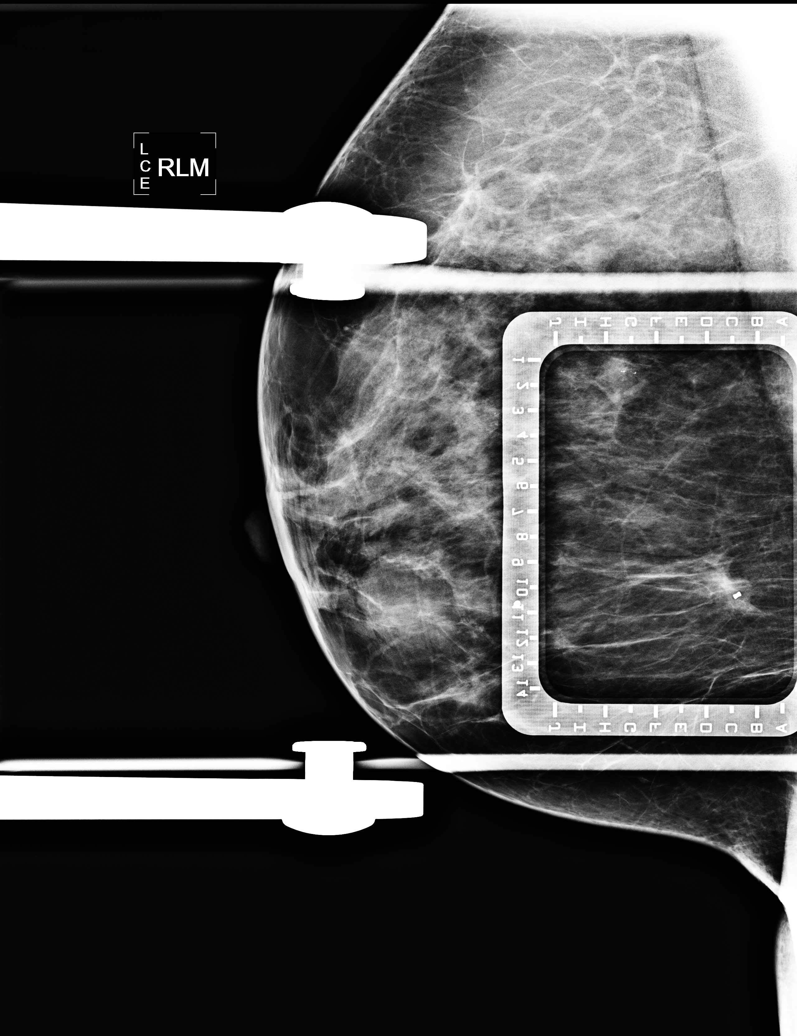

[R CC (2 of 2)]
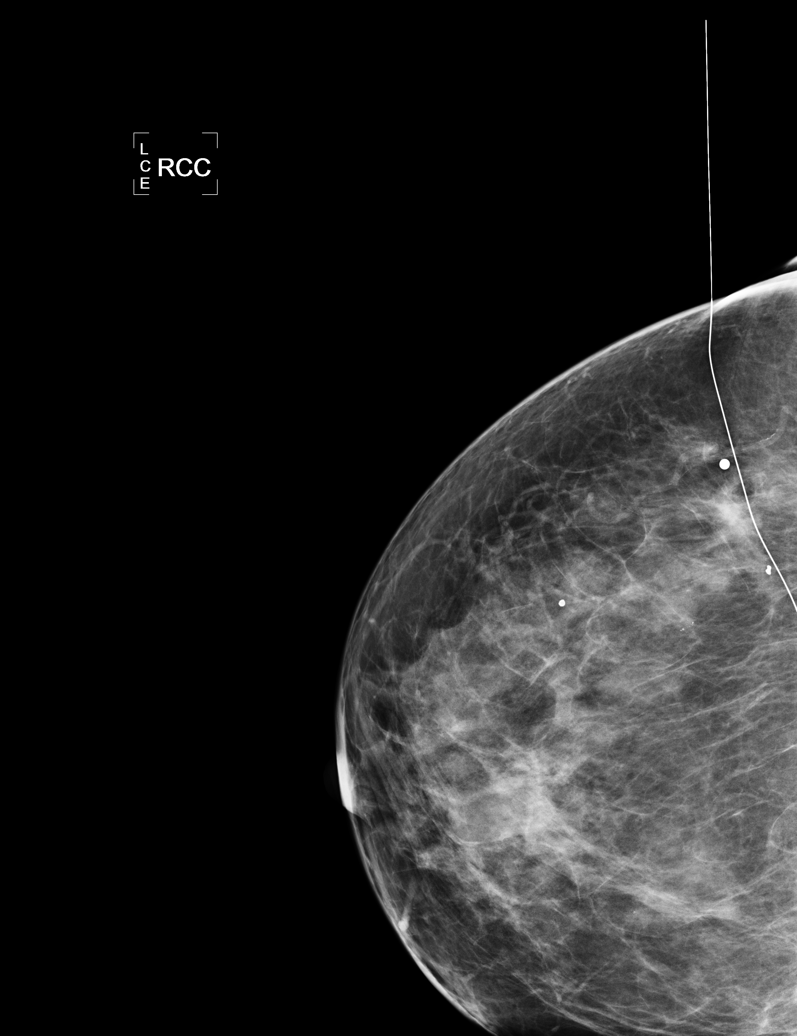

[R LM (2 of 3)]
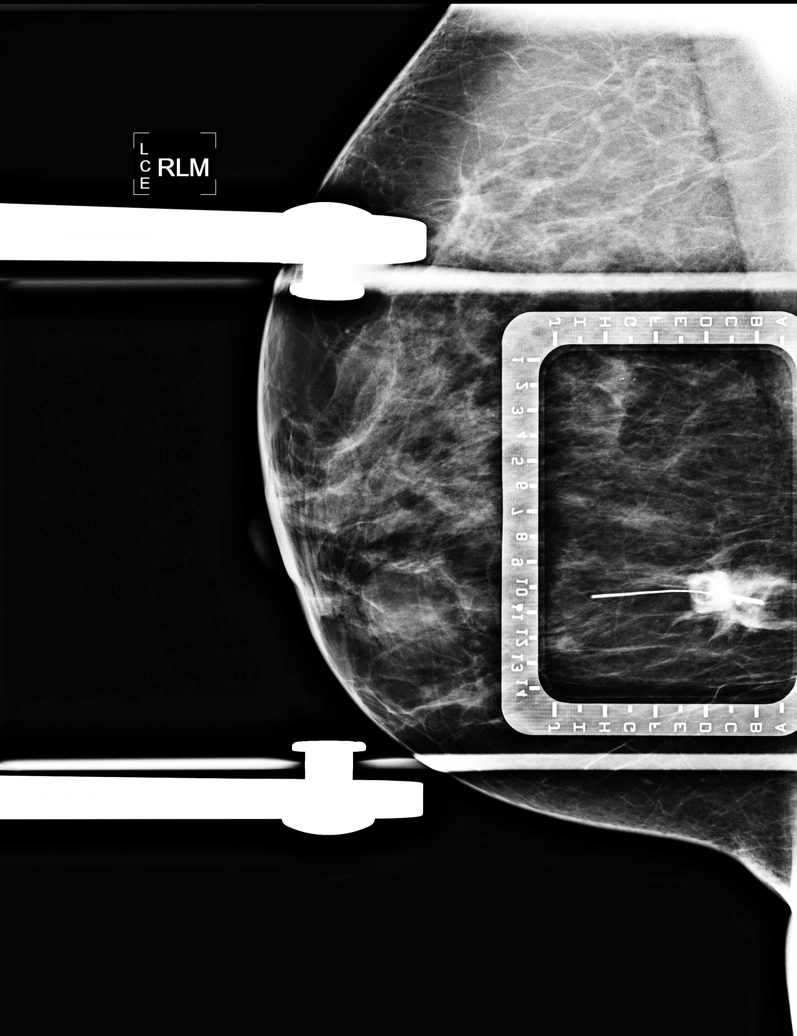

[R LM (3 of 3)]
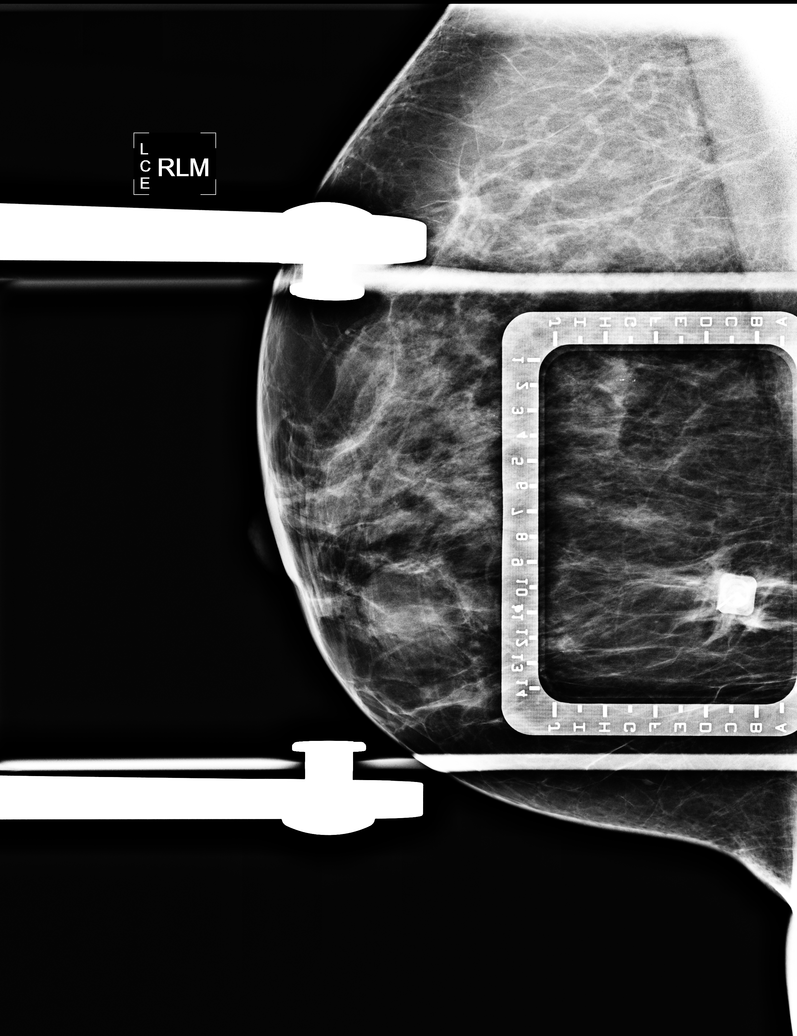

[R MLO]
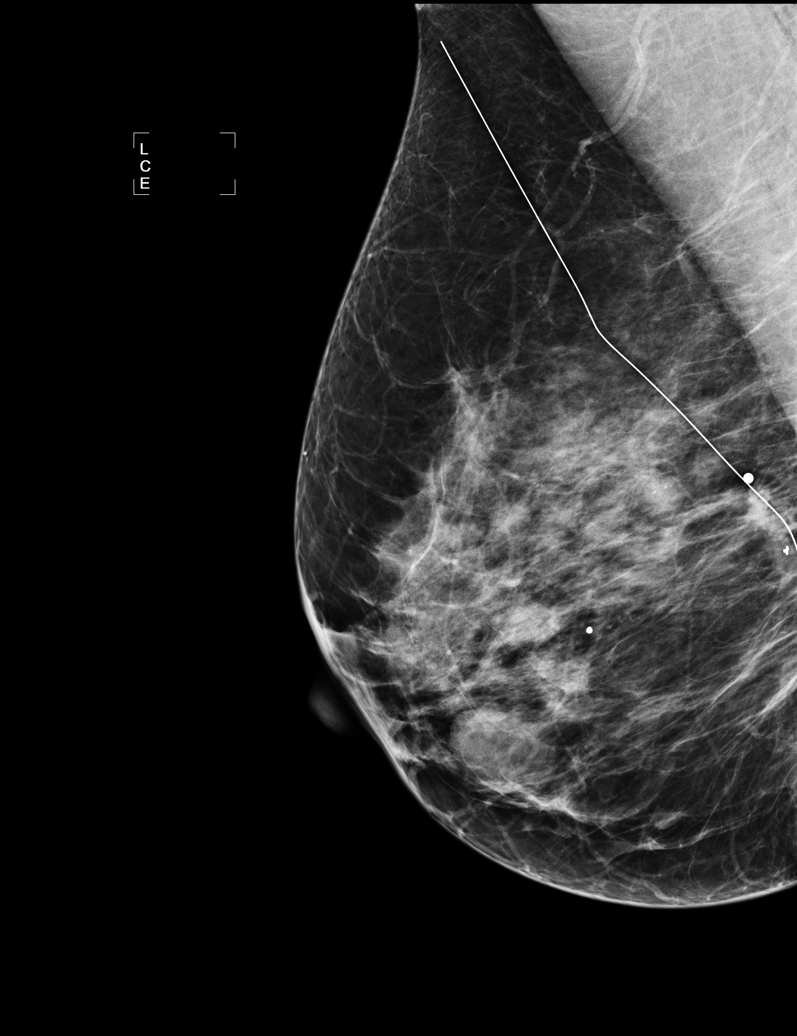

[6 of 6 positions shown; findings below may reference images not displayed]

Patient presents for needle localization prior to right breast
lumpectomy.  I met with the patient and we discussed the procedure
of needle localization including benefits and alternatives. We
discussed the high likelihood of a successful procedure. We
discussed the risks of the procedure, including infection,
bleeding, tissue injury, and further surgery. Informed, written
consent was given.

Using mammographic guidance, sterile technique, 2% lidocaine and a
7 cm modified Kopans needle, the T-shaped tissue marker clip in the
outer right breast, posterior third, was localized using a lateral
approach, the shortest distance to the marker.  The films are
marked for Dr. Eneas.

Specimen radiograph was performed at [facility], and confirms that
the tissue marker clip is present in the tissue sample.  The
specimen is marked for pathology, with the clip located between the
E2, E3, F2, and F3 grid spaces.
IMPRESSION: Needle localization right breast.  No apparent complications.

## 2014-05-18 ENCOUNTER — Ambulatory Visit: Payer: 59 | Admitting: Hematology and Oncology

## 2014-05-18 ENCOUNTER — Ambulatory Visit: Payer: 59 | Admitting: Adult Health

## 2014-05-18 ENCOUNTER — Other Ambulatory Visit: Payer: 59

## 2014-05-18 ENCOUNTER — Ambulatory Visit: Payer: 59 | Admitting: Hematology

## 2014-06-01 ENCOUNTER — Encounter: Payer: Self-pay | Admitting: Hematology

## 2014-06-01 ENCOUNTER — Ambulatory Visit (HOSPITAL_BASED_OUTPATIENT_CLINIC_OR_DEPARTMENT_OTHER): Payer: 59 | Admitting: Hematology

## 2014-06-01 ENCOUNTER — Telehealth: Payer: Self-pay | Admitting: Hematology

## 2014-06-01 ENCOUNTER — Telehealth: Payer: Self-pay | Admitting: *Deleted

## 2014-06-01 ENCOUNTER — Other Ambulatory Visit: Payer: 59

## 2014-06-01 VITALS — BP 125/79 | HR 76 | Temp 98.5°F | Resp 18 | Ht 67.75 in | Wt 159.0 lb

## 2014-06-01 DIAGNOSIS — M8580 Other specified disorders of bone density and structure, unspecified site: Secondary | ICD-10-CM

## 2014-06-01 DIAGNOSIS — C50511 Malignant neoplasm of lower-outer quadrant of right female breast: Secondary | ICD-10-CM

## 2014-06-01 DIAGNOSIS — C50519 Malignant neoplasm of lower-outer quadrant of unspecified female breast: Secondary | ICD-10-CM

## 2014-06-01 DIAGNOSIS — Z170 Estrogen receptor positive status [ER+]: Secondary | ICD-10-CM

## 2014-06-01 MED ORDER — ANASTROZOLE 1 MG PO TABS: ORAL_TABLET | ORAL | Status: DC

## 2014-06-01 NOTE — Telephone Encounter (Signed)
Dr. Burr Medico noted that pt did not have labs today.  She wants pt to have a cbc.  Left message on pt's vm to call back to r/s lab.

## 2014-06-01 NOTE — Progress Notes (Signed)
OFFICE PROGRESS NOTE  CC  [name], MD [place] [address] [place] [address] Dr. Alphonsa Overall  Dr. Arloa Koh   DIAGNOSIS: 59 year old female with diagnosis of invasive ductal carcinoma of the right breast (T1 N0) for f/u.  STAGE:  Cancer of lower-outer quadrant of female breast  Primary site: Breast (Right)  Staging method: AJCC 7th Edition  Clinical: Stage IA (T1c, N0, cM0)  Summary: Stage IA (T1c, N0, cM0)   PRIOR THERAPY: #1On 04/30/2012 patient had a short interval followup of a nodule along the right lower quadrant of the posterior right breast. Interval nodule was seen again along the right lower quadrant. She had an ultrasound done that showed a cyst at the 6:00 position and a 4 x 3 x 4 mm hypoechoic nodule at the 8:00 position 8 cm from the nipple. This was felt to be smaller than a nodule noted in the mammogram and did not correlate to the mammographic findings  #2 Patient had a stereotactic biopsy performed on 06/10/2012. The pathology showed a invasive mammary carcinoma with extravasated mucin. Tumor was low grade, ER positive PR +100% with a proliferation marker Ki-67 of 22%.  #3 s/p right needle loc lumpectomy with right axillary sentinel lymph node biopsy on 08/20/12 with path  1.0 cm IDC with extravasated mucin, 0/3 nodes, ER 100%, PR 100% Her2Neu neg, Ki-67 22%  #3 Oncotype Dx performed on 5/14 with recurrnece score of 13 and distant recurrence rate of 8% , in the low risk category.  #4  Patient underwent adjuvant radiation therapy from 10/01/12 through 10/31/12.    #5 Adjuvant Arimidex starting 12/20/12  CURRENT THERAPY: Arimidex daily  INTERVAL HISTORY: Eleisha Branscomb 59 y.o. female returns for follow up her breast cancer. She was previously under Dr. Laurelyn Sickle care, who have stuffed the practice. I have reviewed her history in chart.   She has been tolerating arimidex well, except some back stiffness. She takes calcium and multivitmine.  She feels well overall. No hot flush, or significant joint pain.   MEDICAL HISTORY: Past Medical History  Diagnosis Date  . Hypertension   . Hepatitis C   . TB (pulmonary tuberculosis)   . GERD (gastroesophageal reflux disease)   . Status post radiation therapy 10/01/12-10/31/12    rt breast/4250cGy 17 sessions/ rt breat boost=750cGy /3 sessions  . Breast cancer     right 2014    ALLERGIES:  is allergic to procardia.  MEDICATIONS:  Current Outpatient Prescriptions  Medication Sig Dispense Refill  . ALPRAZolam (XANAX) 0.25 MG tablet Take 1 tablet (0.25 mg total) by mouth at bedtime as needed for anxiety. 15 tablet 0  . amLODipine (NORVASC) 10 MG tablet Take 10 mg by mouth daily.    Marland Kitchen anastrozole (ARIMIDEX) 1 MG tablet TAKE 1 TABLET BY MOUTH DAILY. 90 tablet 3  . fish oil-omega-3 fatty acids 1000 MG capsule Take 1 g by mouth daily.    Marland Kitchen losartan-hydrochlorothiazide (HYZAAR) 100-25 MG per tablet Take 1 tablet by mouth daily.    . Multiple Vitamin (MULTIVITAMIN WITH MINERALS) TABS Take 1 tablet by mouth daily.     No current facility-administered medications for this visit.     SURGICAL HISTORY:  Past Surgical History  Procedure Laterality Date  . Thyroid cyst excision    . Tubal ligation    . Mandible fracture surgery    . Breast lumpectomy with needle localization and axillary sentinel lymph node bx Right 08/20/2012    Procedure: BREAST LUMPECTOMY WITH NEEDLE  LOCALIZATION AND AXILLARY SENTINEL LYMPH NODE BX;  Surgeon: Shann Medal, MD;  Location: [place];  Service: General;  Laterality: Right;  . Breast surgery      REVIEW OF SYSTEMS:  A 10 point review of systems was conducted and is otherwise negative except for what is noted above.    Health Maintenance  Mammogram:  10/20/2013 Colonoscopy: 2011 Bone Density Scan: baseline I am setting up in August 2015. Pap Smear: 02/2011 Eye Exam: 12/2012 Vitamin D Level: done Lipid Panel: 2013   PHYSICAL EXAMINATION: Blood  pressure 125/79, pulse 76, temperature 98.5 F (36.9 C), temperature source Oral, resp. rate 18, height 5' 7.75" (1.721 m), weight 159 lb (72.122 kg). Body mass index is 24.35 kg/(m^2). General: Patient is a well appearing female in no acute distress HEENT: PERRLA, sclerae anicteric no conjunctival pallor, MMM Neck: supple, no palpable adenopathy Lungs: clear to auscultation bilaterally, no wheezes, rhonchi, or rales Cardiovascular: regular rate rhythm, S1, S2, no murmurs, rubs or gallops Abdomen: Soft, non-tender, non-distended, normoactive bowel sounds, no HSM Extremities: warm and well perfused, no clubbing, cyanosis, or edema Skin: No rashes or lesions Neuro: Non-focal Breasts: Right lumpectomy site without nodularity, hyperpigmentation to right chest wall.  Left breast no nodules or masses. No adenopathy in either axilla and no cervical or supraclavicular adenopathy noted. ECOG PERFORMANCE STATUS: 0 - Asymptomatic    LABORATORY DATA: CBC Latest Ref Rng 11/17/2013 03/24/2013 09/19/2012  WBC 3.9 - 10.3 10e3/uL 6.1 7.3 5.4  Hemoglobin 11.6 - 15.9 g/dL 12.9 13.5 13.3  Hematocrit 34.8 - 46.6 % 40.3 42.2 39.8  Platelets 145 - 400 10e3/uL 160 152 138(L)    CMP Latest Ref Rng 11/17/2013 03/24/2013 09/19/2012  Glucose 70 - 140 mg/dl 93 90 88  BUN 7.0 - 26.0 mg/dL 13.2 18.3 13.6  Creatinine 0.6 - 1.1 mg/dL 0.8 0.8 0.8  Sodium 136 - 145 mEq/L 142 140 140  Potassium 3.5 - 5.1 mEq/L 3.8 3.7 3.7  Chloride 98 - 107 mEq/L - - 104  CO2 22 - 29 mEq/L _0 Calcium 8.4 - 10.4 mg/dL 10.1 10.4 10.0  Total Protein 6.4 - 8.3 g/dL 7.8 8.6(H) 8.0  Total Bilirubin 0.20 - 1.20 mg/dL 0.56 0.31 0.64  Alkaline Phos 40 - 150 U/L 81 77 84  AST 5 - 34 U/L _1 ALT 0 - 55 U/L _2 Thank you  RADIOGRAPHIC STUDIES:  Mammogram 10/20/2013 BIRADS category 2 benign mammogram. Bone density scan 12/30/2013: T-Score: -1.2 (lumber) and -1.7 Femur neck. LOW BONE MASS by WHO Criteria.   ASSESSMENT:  59 year old female with   1. Stage I (T1N0) IDC s/p lumpectomy April 2014 with Alphonsa Overall and SLN, ER 100%, PR 100% Her2Neu neg, Ki-67 22% -She is clinically doing well. No evidence of recurrence. -She will continue anastrozole, a total 5 years adjuvant therapy. -I asked her to have lab tests today and on next visit -She is due for mammogram in June of this year  2. Bone health -Her last bone density scan in September 2015 showed osteopenia. -I encouraged her to continue calcium and vitamin D supplement, and exercise. -Next bone density scan in September 2017.  2. Patient completed adjuvant radiation therapy from 10/01/12 through 10/31/12.    3. Patient started daily Arimidex on  12/20/12.  She is tolerating well and showing compliance to it.  PLAN:  - Lab today and next visit -RTC in 6 month -mammogram end of June  I spent 25 minutes counseling the patient face to face. The total time spent in the appointment was 30 minutes.  Truitt Merle 06/01/2014

## 2014-06-01 NOTE — Telephone Encounter (Signed)
Gave avs & calendar for June/August

## 2014-06-02 ENCOUNTER — Encounter: Payer: Self-pay | Admitting: *Deleted

## 2014-06-02 ENCOUNTER — Other Ambulatory Visit (HOSPITAL_BASED_OUTPATIENT_CLINIC_OR_DEPARTMENT_OTHER): Payer: 59

## 2014-06-02 DIAGNOSIS — C50511 Malignant neoplasm of lower-outer quadrant of right female breast: Secondary | ICD-10-CM

## 2014-06-02 DIAGNOSIS — C50519 Malignant neoplasm of lower-outer quadrant of unspecified female breast: Secondary | ICD-10-CM

## 2014-06-02 LAB — COMPREHENSIVE METABOLIC PANEL (CC13)
ALT: 20 U/L (ref 0–55)
AST: 29 U/L (ref 5–34)
Albumin: 4.3 g/dL (ref 3.5–5.0)
Alkaline Phosphatase: 83 U/L (ref 40–150)
Anion Gap: 13 mEq/L — ABNORMAL HIGH (ref 3–11)
BUN: 14 mg/dL (ref 7.0–26.0)
CO2: 24 mEq/L (ref 22–29)
Calcium: 10 mg/dL (ref 8.4–10.4)
Chloride: 104 mEq/L (ref 98–109)
Creatinine: 0.9 mg/dL (ref 0.6–1.1)
EGFR: 82 mL/min/{1.73_m2} — ABNORMAL LOW (ref >90)
Glucose: 99 mg/dl (ref 70–140)
Potassium: 4 mEq/L (ref 3.5–5.1)
Sodium: 142 mEq/L (ref 136–145)
Total Bilirubin: 0.35 mg/dL (ref 0.20–1.20)
Total Protein: 8.2 g/dL (ref 6.4–8.3)

## 2014-06-02 LAB — CBC WITH DIFFERENTIAL/PLATELET
BASO%: 0.3 % (ref 0.0–2.0)
Basophils Absolute: 0 10*3/uL (ref 0.0–0.1)
EOS%: 2.1 % (ref 0.0–7.0)
Eosinophils Absolute: 0.2 10*3/uL (ref 0.0–0.5)
HCT: 42.3 % (ref 34.8–46.6)
HGB: 13.8 g/dL (ref 11.6–15.9)
LYMPH%: 21.1 % (ref 14.0–49.7)
MCH: 29.2 pg (ref 25.1–34.0)
MCHC: 32.6 g/dL (ref 31.5–36.0)
MCV: 89.4 fL (ref 79.5–101.0)
MONO#: 0.4 10*3/uL (ref 0.1–0.9)
MONO%: 4.7 % (ref 0.0–14.0)
NEUT#: 5.5 10*3/uL (ref 1.5–6.5)
NEUT%: 71.8 % (ref 38.4–76.8)
Platelets: 164 10*3/uL (ref 145–400)
RBC: 4.73 10*6/uL (ref 3.70–5.45)
RDW: 13.5 % (ref 11.2–14.5)
WBC: 7.6 10*3/uL (ref 3.9–10.3)
lymph#: 1.6 10*3/uL (ref 0.9–3.3)

## 2014-06-02 NOTE — [facility] Oncology Navigator Note (Signed)
I called patient yesterday and then met with her today when she was at [facility] for labs.  Patient reports that she is doing well.  She continues to take her Arimidex and tolerating it well.  She shared that her sister was recently diagnosed with stage IV cancer.  She also reports that her mother has moved in with her so that she can provide care since she has dementia.  We discussed including time for her to take care of herself. She said that she discussed genetic counseling with Dr. Burr Medico and she is interested in pursuing.  I told her that I would follow up with Dr. Burr Medico.  She denied any other questions or concerns at this time.  I encouraged her to call me for any needs.

## 2014-06-03 ENCOUNTER — Encounter: Payer: Self-pay | Admitting: Hematology

## 2014-06-04 NOTE — Addendum Note (Signed)
Addended by: Truitt Merle on: 06/04/2014 05:28 PM   Modules accepted: Orders

## 2014-06-05 ENCOUNTER — Telehealth: Payer: Self-pay | Admitting: Hematology

## 2014-06-05 NOTE — Telephone Encounter (Signed)
Left message to confirm genetic referral. Mailed calendar.

## 2014-06-08 ENCOUNTER — Encounter: Payer: Self-pay | Admitting: *Deleted

## 2014-06-08 ENCOUNTER — Telehealth: Payer: Self-pay | Admitting: Genetic Counselor

## 2014-06-08 NOTE — Telephone Encounter (Signed)
lt mess regarding appt. on 06/17/14 at 10am for Genetics Dx: Genetic testing Referring Dr. Burr Medico

## 2014-06-09 NOTE — [facility] Oncology Navigator Note (Signed)
Called patient and left a voicemail message to remind her of her scheduled Genetic Counseling appointment.

## 2014-06-17 ENCOUNTER — Other Ambulatory Visit: Payer: 59

## 2014-06-17 ENCOUNTER — Telehealth: Payer: Self-pay | Admitting: Hematology

## 2014-06-17 ENCOUNTER — Encounter: Payer: 59 | Admitting: Genetic Counselor

## 2014-06-17 NOTE — Telephone Encounter (Signed)
Returned patient call to reschedule appointment. Patient confirmed appointment for 03/17. Patient did not want calendar.

## 2014-07-09 ENCOUNTER — Telehealth: Payer: Self-pay | Admitting: Hematology

## 2014-07-09 ENCOUNTER — Other Ambulatory Visit: Payer: 59

## 2014-07-09 NOTE — Telephone Encounter (Signed)
Returned voice mail to reschedule appointment from 03/17 to 03/31. Patient confirmed.

## 2014-07-23 ENCOUNTER — Other Ambulatory Visit (HOSPITAL_BASED_OUTPATIENT_CLINIC_OR_DEPARTMENT_OTHER): Payer: 59

## 2014-07-23 ENCOUNTER — Ambulatory Visit (HOSPITAL_BASED_OUTPATIENT_CLINIC_OR_DEPARTMENT_OTHER): Payer: 59 | Admitting: Genetic Counselor

## 2014-07-23 DIAGNOSIS — C50519 Malignant neoplasm of lower-outer quadrant of unspecified female breast: Secondary | ICD-10-CM

## 2014-07-23 DIAGNOSIS — C50511 Malignant neoplasm of lower-outer quadrant of right female breast: Secondary | ICD-10-CM | POA: Diagnosis not present

## 2014-07-23 DIAGNOSIS — Z803 Family history of malignant neoplasm of breast: Secondary | ICD-10-CM

## 2014-07-23 DIAGNOSIS — Z800 Family history of malignant neoplasm of digestive organs: Secondary | ICD-10-CM | POA: Diagnosis not present

## 2014-07-23 DIAGNOSIS — C50911 Malignant neoplasm of unspecified site of right female breast: Secondary | ICD-10-CM

## 2014-07-23 DIAGNOSIS — Z315 Encounter for genetic counseling: Secondary | ICD-10-CM | POA: Diagnosis not present

## 2014-07-23 LAB — CBC WITH DIFFERENTIAL/PLATELET
BASO%: 0.9 % (ref 0.0–2.0)
Basophils Absolute: 0.1 10*3/uL (ref 0.0–0.1)
EOS%: 2.1 % (ref 0.0–7.0)
Eosinophils Absolute: 0.2 10*3/uL (ref 0.0–0.5)
HCT: 41.3 % (ref 34.8–46.6)
HGB: 13.2 g/dL (ref 11.6–15.9)
LYMPH%: 22.3 % (ref 14.0–49.7)
MCH: 28.1 pg (ref 25.1–34.0)
MCHC: 32 g/dL (ref 31.5–36.0)
MCV: 87.7 fL (ref 79.5–101.0)
MONO#: 0.4 10*3/uL (ref 0.1–0.9)
MONO%: 6 % (ref 0.0–14.0)
NEUT#: 5 10*3/uL (ref 1.5–6.5)
NEUT%: 68.7 % (ref 38.4–76.8)
Platelets: 157 10*3/uL (ref 145–400)
RBC: 4.71 10*6/uL (ref 3.70–5.45)
RDW: 14.5 % (ref 11.2–14.5)
WBC: 7.3 10*3/uL (ref 3.9–10.3)
lymph#: 1.6 10*3/uL (ref 0.9–3.3)

## 2014-07-23 LAB — COMPREHENSIVE METABOLIC PANEL (CC13)
ALT: 19 U/L (ref 0–55)
AST: 18 U/L (ref 5–34)
Albumin: 4.3 g/dL (ref 3.5–5.0)
Alkaline Phosphatase: 84 U/L (ref 40–150)
Anion Gap: 11 mEq/L (ref 3–11)
BUN: 19.2 mg/dL (ref 7.0–26.0)
CO2: 27 mEq/L (ref 22–29)
Calcium: 10.2 mg/dL (ref 8.4–10.4)
Chloride: 104 mEq/L (ref 98–109)
Creatinine: 0.9 mg/dL (ref 0.6–1.1)
EGFR: 86 mL/min/{1.73_m2} — ABNORMAL LOW (ref >90)
Glucose: 87 mg/dl (ref 70–140)
Potassium: 3.3 mEq/L — ABNORMAL LOW (ref 3.5–5.1)
Sodium: 143 mEq/L (ref 136–145)
Total Bilirubin: 0.55 mg/dL (ref 0.20–1.20)
Total Protein: 8 g/dL (ref 6.4–8.3)

## 2014-07-23 NOTE — Progress Notes (Signed)
Sharon Nguyen Patient Visit  REFERRING PROVIDER: Maury Dus, MD [address] [place], [place] [address]  PRIMARY PROVIDER:  [name], MD  PRIMARY REASON FOR VISIT:  1. Breast CA, right   2. Family history of malignant neoplasm of gastrointestinal tract   3. Family history of malignant neoplasm of breast     HISTORY OF PRESENT ILLNESS:   Anaelle Dunton, a 59 y.o. female, was seen for a [place] cancer genetics consultation at the request of Dr. Alyson Ingles due to a personal and family history of cancer.  Ms. Ikner presents to clinic today to discuss the possibility of a hereditary predisposition to cancer, genetic testing, and to further clarify her future cancer risks, as well as potential cancer risks for family members.   CANCER HISTORY:  Ms. Dorsi was diagnosed with right breast IDC 9ER/PR+, Her2-) at the age of 33. She is s/p right lumpectomy and adjuvant radiation and Arimidex. She has no prior history of cancer.   Past Medical History  Diagnosis Date   Hypertension    Hepatitis C    TB (pulmonary tuberculosis)    GERD (gastroesophageal reflux disease)    Status post radiation therapy 10/01/12-10/31/12    rt breast/4250cGy 17 sessions/ rt breat boost=750cGy /3 sessions   Breast cancer     right 2014    Past Surgical History  Procedure Laterality Date   Thyroid cyst excision     Tubal ligation     Mandible fracture surgery     Breast lumpectomy with needle localization and axillary sentinel lymph node bx Right 08/20/2012    Procedure: BREAST LUMPECTOMY WITH NEEDLE LOCALIZATION AND AXILLARY SENTINEL LYMPH NODE BX;  Surgeon: Shann Medal, MD;  Location: [place];  Service: General;  Laterality: Right;   Breast surgery      History   Social History   Marital Status: Widowed    Spouse Name: N/A   Number of Children: 4   Years of Education: N/A   Occupational History   Engineer, structural at [employer]   Social History Main Topics   Smoking status: Former Smoker   Smokeless tobacco: Not on file   Alcohol Use: No   Drug Use: No   Sexual Activity: Not Currently   Other Topics Concern   Not on file   Social History Narrative   Recently widowed April 2014 from bile duct cancer.   Has 4 sons 2 in [place], 1 in [place] and 1 in [place]:  During the visit, a 4-generation pedigree was obtained. A copy of the pedigree with be scanned into Epic under the Media tab. Significant family history diagnoses include the following: Family History  Problem Relation Age of Onset   Mesothelioma Father     died age 76   Cancer Father 36     colon cancer, d. at 77   Cancer Sister 44    gastric cancer    Cancer Maternal Uncle 89    pancreatic   Cancer Other 38    maternal female cousin once removed with breast cancer in early 70s   Ms. Railey's ancestry is of [place], [place] and [place] descent. There is no known Jewish ancestry or consanguinity.  GENETIC COUNSELING ASSESSMENT:  Ms. Duby is a 59 y.o. female with a personal and family history of cancer suggestive of a hereditary predisposition to cancer. We, therefore,  discussed and recommended the following at today's visit.   DISCUSSION:  We reviewed the characteristics, features and inheritance patterns of hereditary cancer syndromes. We also discussed genetic testing, including the appropriate family members to test, the process of testing, insurance coverage and turn-around-time for results. We discussed the implications of a negative, positive and/or variant of uncertain significant result. We recommended Ms. Hauck pursue genetic testing for the OvaNext gene panel. The OvaNext gene panel offered by [facility] and includes sequencing and rearrangement analysis for the following 24 genes:ATM, BARD1, BRCA1, BRCA2, BRIP1, CDH1, CHEK2, EPCAM, MLH1, MRE11A, MSH2, MSH6, MUTYH, NBN,  NF1, PALB2, PMS2, PTEN, RAD50, RAD51C, RAD51D, SMARCA4, STK11, and TP53.   PLAN:  Based on our above recommendation, Ms. Lasala wished to pursue genetic testing and the blood sample was drawn and will be sent to [facility] for analysis. Results should be available within approximately 4 weeks time, at which point they will be disclosed by telephone to Ms. Ildefonso, as will any additional recommendations warranted by these results. Lastly, we encouraged Ms. Eckerson to remain in contact with cancer genetics annually so that we can continuously update the family history and inform her of any changes in cancer genetics and testing that may be of benefit for this family.   Ms.  Risenhoover questions were answered to her satisfaction today. Our contact information was provided should additional questions or concerns arise. Thank you for the referral and allowing [place] to share in the care of your patient.   Catherine A. Fine, MS, CGC Certified Psychologist, [email or web address] phone: [phone]  The patient was seen for a total of 40 minutes in face-to-face genetic counseling.  This patient was discussed with Dr. Burr Medico who agrees with the above.    ______________________________________________________________________ For Office Staff:  Number of people involved in session including genetic counselor: 2 Was an intern or student involved with case: not applicable

## 2014-08-13 ENCOUNTER — Encounter: Payer: Self-pay | Admitting: Genetic Counselor

## 2014-08-13 DIAGNOSIS — Z800 Family history of malignant neoplasm of digestive organs: Secondary | ICD-10-CM

## 2014-08-13 DIAGNOSIS — Z803 Family history of malignant neoplasm of breast: Secondary | ICD-10-CM

## 2014-08-13 DIAGNOSIS — C50911 Malignant neoplasm of unspecified site of right female breast: Secondary | ICD-10-CM

## 2014-08-13 NOTE — Progress Notes (Signed)
[facility] Genetic Test Results   REFERRING PROVIDER: Dr. Truitt Merle  PRIMARY PROVIDER:  [name], MD  PRIMARY REASON FOR VISIT:  Personal history of breast cancer  GENETIC TEST RESULT:  Testing Laboratory: [facility]  Test Ordered: OvaNext gene panel Date of Report: 08/12/2014 Result: Normal, no pathogenic mutations identified General Interpretation: Reassuring  HPI: Ms. Frieson was previously seen in the [facility] due to concerns regarding a hereditary predisposition to cancer. Please refer to our prior cancer genetics clinic note for more information regarding Ms. Yamaguchi's medical, social and family histories, and our assessment and recommendations, at the time. Ms. Apostol genetic test results and recommendations warranted by these results were recently disclosed to her and are discussed in more detail below.  GENETIC TEST RESULTS: At the time of Ms. Umbaugh's visit, we recommended she pursue genetic testing, which includes sequencing and deletion/duplication analysis of several genes associated with an increased risk for cancer via a gene panel. The OvaNext gene panel offered by [employer] includes sequencing and rearrangement analysis for the following 24 genes:ATM, BARD1, BRCA1, BRCA2, BRIP1, CDH1, CHEK2, EPCAM, MLH1, MRE11A, MSH2, MSH6, MUTYH, NBN, NF1, PALB2, PMS2, PTEN, RAD50, RAD51C, RAD51D, SMARCA4, STK11, and TP53. Genetic testing for this gene panel was normal and did not reveal a pathogenic mutation in any of these genes. A copy of the genetic test report will be scanned into Epic under the media tab.   We discussed with Ms. Banbury that current genetic testing is not perfect, and it is, therefore, possible there may be a pathogenic gene mutation in one of these genes that current testing cannot detect, but that chance is small.  We also discussed, that it is possible that another gene that has not  yet been discovered, or that we have not yet tested, is responsible for the cancer diagnoses in her family. It is, therefore, important for Ms. Ng to continue to remain in touch with cancer genetics so that we can continue to offer Ms. Rautio the most up to date genetic testing.   CANCER SCREENING RECOMMENDATIONS: This result is reassuring and indicates that Ms. Requejo likely does not have an increased risk for a future cancer due to a mutation in one of these genes. This normal test also suggests that Ms. Cowles's cancer was most likely not due to an inherited predisposition associated with one of these genes.  Most cancers happen by chance and this negative test suggests that her cancer falls into this category.  We, therefore, recommended she continue to follow the cancer management and screening guidelines provided by her oncology and primary healthcare providers.   RECOMMENDATIONS FOR FAMILY MEMBERS:  While these results are reassuring for Ms. Quijas and her children, this test does not tell us anything about Ms. Gick's siblings' or relatives' risks. We recommended these relatives also have genetic counseling and testing and are happy to help facilitate testing and/or a referral if needed. Cancer genetic counselors can also be located, by visiting the website of the [employer] ([employer][email or web address]) and Field seismologist for a Oncologist by zip code.  FOLLOW-UP: Lastly, we discussed with Ms. Chesmore that cancer genetics is a rapidly advancing field and it is likely that new genetic tests will be appropriate for her and/or family members in the future. We encouraged her to remain in contact with cancer genetics on an annual basis so we can update her personal and family histories and  let her know of advances in cancer genetics that may benefit this family.   Our contact number was provided. Ms. Klute questions were answered to her satisfaction, and she knows she is welcome to  call us at anytime with additional questions or concerns.    Catherine A. Fine, MS, CGC Certified Psychologist, [email or web address] Phone: [phone]

## 2014-10-21 ENCOUNTER — Telehealth: Payer: Self-pay | Admitting: *Deleted

## 2014-10-21 NOTE — Telephone Encounter (Signed)
I called patient to check in and remind her of her appointment tomorrow.  I left a voicemail message with contact information and request that she return my call.

## 2014-11-30 ENCOUNTER — Encounter: Payer: 59 | Admitting: Hematology

## 2014-11-30 ENCOUNTER — Other Ambulatory Visit: Payer: 59

## 2014-11-30 ENCOUNTER — Telehealth: Payer: Self-pay | Admitting: Hematology

## 2014-11-30 NOTE — Progress Notes (Signed)
No show  This encounter was created in error - please disregard.

## 2014-11-30 NOTE — Telephone Encounter (Signed)
S/w pt confirming labs/ov r/s due to past 2 no shows..... Per 08/08 POF... KJ

## 2014-12-08 ENCOUNTER — Ambulatory Visit
Admission: RE | Admit: 2014-12-08 | Discharge: 2014-12-08 | Disposition: A | Discharge status: Home / Self Care | Payer: 59 | Source: Ambulatory Visit | Attending: Hematology | Admitting: Hematology

## 2014-12-08 DIAGNOSIS — C50519 Malignant neoplasm of lower-outer quadrant of unspecified female breast: Secondary | ICD-10-CM

## 2014-12-10 ENCOUNTER — Encounter: Payer: Self-pay | Admitting: Hematology

## 2014-12-10 ENCOUNTER — Telehealth: Payer: Self-pay | Admitting: Hematology

## 2014-12-10 ENCOUNTER — Ambulatory Visit (HOSPITAL_BASED_OUTPATIENT_CLINIC_OR_DEPARTMENT_OTHER): Payer: 59 | Admitting: Hematology

## 2014-12-10 ENCOUNTER — Other Ambulatory Visit (HOSPITAL_BASED_OUTPATIENT_CLINIC_OR_DEPARTMENT_OTHER): Payer: 59

## 2014-12-10 VITALS — BP 116/77 | HR 82 | Temp 98.8°F | Resp 18 | Ht 67.75 in | Wt 161.9 lb

## 2014-12-10 DIAGNOSIS — C50519 Malignant neoplasm of lower-outer quadrant of unspecified female breast: Secondary | ICD-10-CM

## 2014-12-10 DIAGNOSIS — C50511 Malignant neoplasm of lower-outer quadrant of right female breast: Secondary | ICD-10-CM

## 2014-12-10 DIAGNOSIS — M810 Age-related osteoporosis without current pathological fracture: Secondary | ICD-10-CM | POA: Diagnosis not present

## 2014-12-10 LAB — CBC WITH DIFFERENTIAL/PLATELET
BASO%: 0.5 % (ref 0.0–2.0)
Basophils Absolute: 0 10*3/uL (ref 0.0–0.1)
EOS%: 1.8 % (ref 0.0–7.0)
Eosinophils Absolute: 0.1 10*3/uL (ref 0.0–0.5)
HCT: 40.5 % (ref 34.8–46.6)
HGB: 13.4 g/dL (ref 11.6–15.9)
LYMPH%: 21.7 % (ref 14.0–49.7)
MCH: 29.3 pg (ref 25.1–34.0)
MCHC: 33.1 g/dL (ref 31.5–36.0)
MCV: 88.6 fL (ref 79.5–101.0)
MONO#: 0.4 10*3/uL (ref 0.1–0.9)
MONO%: 6.5 % (ref 0.0–14.0)
NEUT#: 4.5 10*3/uL (ref 1.5–6.5)
NEUT%: 69.5 % (ref 38.4–76.8)
Platelets: 155 10*3/uL (ref 145–400)
RBC: 4.57 10*6/uL (ref 3.70–5.45)
RDW: 13.7 % (ref 11.2–14.5)
WBC: 6.5 10*3/uL (ref 3.9–10.3)
lymph#: 1.4 10*3/uL (ref 0.9–3.3)

## 2014-12-10 LAB — COMPREHENSIVE METABOLIC PANEL (CC13)
ALT: 20 U/L (ref 0–55)
AST: 17 U/L (ref 5–34)
Albumin: 4.3 g/dL (ref 3.5–5.0)
Alkaline Phosphatase: 85 U/L (ref 40–150)
Anion Gap: 11 mEq/L (ref 3–11)
BUN: 13.5 mg/dL (ref 7.0–26.0)
CO2: 27 mEq/L (ref 22–29)
Calcium: 11 mg/dL — ABNORMAL HIGH (ref 8.4–10.4)
Chloride: 104 mEq/L (ref 98–109)
Creatinine: 0.9 mg/dL (ref 0.6–1.1)
EGFR: 78 mL/min/{1.73_m2} — ABNORMAL LOW (ref >90)
Glucose: 87 mg/dl (ref 70–140)
Potassium: 3.7 mEq/L (ref 3.5–5.1)
Sodium: 142 mEq/L (ref 136–145)
Total Bilirubin: 0.42 mg/dL (ref 0.20–1.20)
Total Protein: 7.7 g/dL (ref 6.4–8.3)

## 2014-12-10 NOTE — Progress Notes (Signed)
[facility]  OFFICE PROGRESS NOTE  CC  [name], MD [address] [place] [address] Dr. Alphonsa Overall  Dr. Arloa Koh   DIAGNOSIS: 59 year old female with diagnosis of invasive ductal carcinoma of the right breast (T1 N0) for f/u.  STAGE:  Cancer of lower-outer quadrant of female breast  Primary site: Breast (Right)  Staging method: AJCC 7th Edition  Clinical: Stage IA (T1c, N0, cM0)  Summary: Stage IA (T1c, N0, cM0)   PRIOR THERAPY: #1On 04/30/2012 patient had a short interval followup of a nodule along the right lower quadrant of the posterior right breast. Interval nodule was seen again along the right lower quadrant. She had an ultrasound done that showed a cyst at the 6:00 position and a 4 x 3 x 4 mm hypoechoic nodule at the 8:00 position 8 cm from the nipple. This was felt to be smaller than a nodule noted in the mammogram and did not correlate to the mammographic findings  #2 Patient had a stereotactic biopsy performed on 06/10/2012. The pathology showed a invasive mammary carcinoma with extravasated mucin. Tumor was low grade, ER positive PR +100% with a proliferation marker Ki-67 of 22%.  #3 s/p right needle loc lumpectomy with right axillary sentinel lymph node biopsy on 08/20/12 with path  1.0 cm IDC with extravasated mucin, 0/3 nodes, ER 100%, PR 100% Her2Neu neg, Ki-67 22%  #3 Oncotype Dx performed on 5/14 with recurrnece score of 13 and distant recurrence rate of 8% , in the low risk category.  #4  Patient underwent adjuvant radiation therapy from 10/01/12 through 10/31/12.    #5 Adjuvant Arimidex starting 12/20/12  CURRENT THERAPY: Arimidex daily  INTERVAL HISTORY: Timberlynn Kizziah 59 y.o. female returns for follow up her breast cancer. I saw her 6 months ago last time. She is doing very well, takes Arimidex every day, denies any noticeable side effects. She takes calcium and vitamin once daily, no hot flash or joint  discomfort. She has recently bought exercise equipment at home, plan to start soon. She had a screening mammogram 2 days ago, which was negative.  MEDICAL HISTORY: Past Medical History  Diagnosis Date  . Hypertension   . Hepatitis C   . TB (pulmonary tuberculosis)   . GERD (gastroesophageal reflux disease)   . Status post radiation therapy 10/01/12-10/31/12    rt breast/4250cGy 17 sessions/ rt breat boost=750cGy /3 sessions  . Breast cancer     right 2014    ALLERGIES:  is allergic to procardia.  MEDICATIONS:  Current Outpatient Prescriptions  Medication Sig Dispense Refill  . ALPRAZolam (XANAX) 0.25 MG tablet Take 1 tablet (0.25 mg total) by mouth at bedtime as needed for anxiety. 15 tablet 0  . amLODipine (NORVASC) 10 MG tablet Take 10 mg by mouth daily.    Marland Kitchen anastrozole (ARIMIDEX) 1 MG tablet TAKE 1 TABLET BY MOUTH DAILY. 90 tablet 3  . Calcium Carb-Cholecalciferol (CALCIUM 600 + D) 600-200 MG-UNIT TABS Take 1 tablet by mouth daily.    . fish oil-omega-3 fatty acids 1000 MG capsule Take 1 g by mouth daily.    Marland Kitchen losartan-hydrochlorothiazide (HYZAAR) 100-25 MG per tablet Take 1 tablet by mouth daily.    . Multiple Vitamin (MULTIVITAMIN WITH MINERALS) TABS Take 1 tablet by mouth daily.     No current facility-administered medications for this visit.     SURGICAL HISTORY:  Past Surgical History  Procedure Laterality Date  . Thyroid cyst excision    .  Tubal ligation    . Mandible fracture surgery    . Breast lumpectomy with needle localization and axillary sentinel lymph node bx Right 08/20/2012    Procedure: BREAST LUMPECTOMY WITH NEEDLE LOCALIZATION AND AXILLARY SENTINEL LYMPH NODE BX;  Surgeon: Shann Medal, MD;  Location: [facility];  Service: General;  Laterality: Right;  . Breast surgery      REVIEW OF SYSTEMS:  A 10 point review of systems was conducted and is otherwise negative except for what is noted above.    Health Maintenance  Mammogram:  10/20/2013 Colonoscopy:  2011 Bone Density Scan: baseline I am setting up in August 2015. Pap Smear: 02/2011 Eye Exam: 12/2012 Vitamin D Level: done Lipid Panel: 2013   PHYSICAL EXAMINATION: Blood pressure 116/77, pulse 82, temperature 98.8 F (37.1 C), temperature source Oral, resp. rate 18, height 5' 7.75" (1.721 m), weight 161 lb 14.4 oz (73.437 kg). Body mass index is 24.79 kg/(m^2). General: Patient is a well appearing female in no acute distress HEENT: PERRLA, sclerae anicteric no conjunctival pallor, MMM Neck: supple, no palpable adenopathy Lungs: clear to auscultation bilaterally, no wheezes, rhonchi, or rales Cardiovascular: regular rate rhythm, S1, S2, no murmurs, rubs or gallops Abdomen: Soft, non-tender, non-distended, normoactive bowel sounds, no HSM Extremities: warm and well perfused, no clubbing, cyanosis, or edema Skin: No rashes or lesions Neuro: Non-focal Breasts: Right lumpectomy site without nodularity, hyperpigmentation to right chest wall.  Left breast no nodules or masses. No adenopathy in either axilla and no cervical or supraclavicular adenopathy noted. ECOG PERFORMANCE STATUS: 0 - Asymptomatic    LABORATORY DATA: CBC Latest Ref Rng 12/10/2014 07/23/2014 06/02/2014  WBC 3.9 - 10.3 10e3/uL 6.5 7.3 7.6  Hemoglobin 11.6 - 15.9 g/dL 13.4 13.2 13.8  Hematocrit 34.8 - 46.6 % 40.5 41.3 42.3  Platelets 145 - 400 10e3/uL 155 157 164    CMP Latest Ref Rng 12/10/2014 07/23/2014 06/02/2014  Glucose 70 - 140 mg/dl 87 87 99  BUN 7.0 - 26.0 mg/dL 13.5 19.2 14.0  Creatinine 0.6 - 1.1 mg/dL 0.9 0.9 0.9  Sodium 136 - 145 mEq/L 142 143 142  Potassium 3.5 - 5.1 mEq/L 3.7 3.3(L) 4.0  Chloride 98 - 107 mEq/L - - -  CO2 22 - 29 mEq/L _0 Calcium 8.4 - 10.4 mg/dL 11.0(H) 10.2 10.0  Total Protein 6.4 - 8.3 g/dL 7.7 8.0 8.2  Total Bilirubin 0.20 - 1.20 mg/dL 0.42 0.55 0.35  Alkaline Phos 40 - 150 U/L 85 84 83  AST 5 - 34 U/L _1 ALT 0 - 55 U/L _2 Thank you  RADIOGRAPHIC  STUDIES:  Mammogram 12/08/2014 IMPRESSION: No evidence of recurrent or new breast malignancy. Benign postsurgical changes on the right.  Bone density scan 12/30/2013: T-Score: -1.2 (lumber) and -1.7 Femur neck. LOW BONE MASS by WHO Criteria.   ASSESSMENT: 59 year old female with   1. Stage I (T1N0) IDC s/p lumpectomy April 2014 with Alphonsa Overall and SLN, ER 100%, PR 100% Her2Neu neg, Ki-67 22% -She is clinically doing well. Physical exam and recent mammogram showed no evidence of recurrence. -She will continue anastrozole, a total 5 years adjuvant therapy. She asked about if she needs to take anastrozole for a total of 10 years, giving her early stage disease, I think it 5 years is probably adequate. We do have recent clinical data that 10 years of aromatase inhibitor decreases disease free survival, however no benefit of overall survival. -Lab  reviewed, normal CBC and CMP, except slightly elevated calcium at 11, will watch it closely, decrease her calcium intake, will check PTH next time.   2. Bone health -Her last bone density scan in September 2015 showed osteopenia. -I encouraged her to continue calcium and vitamin D supplement, and exercise. Due to her mild hypercalcemia, will decrease to calcium every other day  -Next bone density scan in September 2017.   PLAN:  -RTC in 6 month with lab, including CBC, CMP VitD, and PTH    I spent 20 minutes counseling the patient face to face. The total time spent in the appointment was 25 minutes.  Truitt Merle 12/10/2014

## 2014-12-10 NOTE — Telephone Encounter (Signed)
Pt confirmed labs/ov per 08/18 POF, gave pt avs and calendar... KJ

## 2014-12-11 ENCOUNTER — Telehealth: Payer: Self-pay | Admitting: Hematology

## 2014-12-11 NOTE — Telephone Encounter (Signed)
perpof to sch lab 1 wk prior to appt-mailed corrected avs

## 2014-12-25 ENCOUNTER — Telehealth: Payer: Self-pay | Admitting: *Deleted

## 2014-12-25 NOTE — Telephone Encounter (Signed)
  Oncology Nurse Navigator Documentation    Navigator Encounter Type: Telephone (12/25/14 1100) Patient Visit Type: Follow-up (12/25/14 1100)   Barriers/Navigation Needs: No barriers at this time (12/25/14 1100)   Interventions: None required (12/25/14 1100)  I called patient to check in.  She reports that she is doing well.  We discussed her recent visit with Dr. Burr Medico and she was able to teachback instructions she was given.  Patient reports that coming to the [facility] is emotional for her because she is reminded of her husband and his frequent visits here before he died.  I offered to refer her to a counselor if desired.  She is also busy caring for her mother in her home who has dementia.  She denied any questions at this time.  I encouraged her to call me for any concerns or needs.         Time Spent with Patient: 15 (12/25/14 1100)

## 2015-04-16 ENCOUNTER — Telehealth: Payer: Self-pay | Admitting: *Deleted

## 2015-04-16 NOTE — Telephone Encounter (Signed)
  Oncology Nurse Navigator Documentation    Navigator Encounter Type: Telephone (04/16/15 1403)     Barriers/Navigation Needs: No barriers at this time (04/16/15 1403)   Interventions: None required (04/16/15 1403)  I called patient to check in.  She reports that she is doing well.  She continues to care for her mother in her home.  She reports that this year is the second Christmas since her husband passed away and it has been more difficult.  She denies any needs at this time.  We reviewed her next appointments.  I encouraged her to call me for any assistance.         Time Spent with Patient: 15 (04/16/15 1403)

## 2015-06-04 ENCOUNTER — Telehealth: Payer: Self-pay | Admitting: *Deleted

## 2015-06-04 ENCOUNTER — Other Ambulatory Visit (HOSPITAL_BASED_OUTPATIENT_CLINIC_OR_DEPARTMENT_OTHER): Payer: 59

## 2015-06-04 DIAGNOSIS — C50519 Malignant neoplasm of lower-outer quadrant of unspecified female breast: Secondary | ICD-10-CM

## 2015-06-04 DIAGNOSIS — C50511 Malignant neoplasm of lower-outer quadrant of right female breast: Secondary | ICD-10-CM

## 2015-06-04 LAB — CBC WITH DIFFERENTIAL/PLATELET
BASO%: 1 % (ref 0.0–2.0)
Basophils Absolute: 0.1 10*3/uL (ref 0.0–0.1)
EOS%: 2.6 % (ref 0.0–7.0)
Eosinophils Absolute: 0.2 10*3/uL (ref 0.0–0.5)
HCT: 43.8 % (ref 34.8–46.6)
HGB: 14 g/dL (ref 11.6–15.9)
LYMPH%: 23.5 % (ref 14.0–49.7)
MCH: 28.1 pg (ref 25.1–34.0)
MCHC: 31.9 g/dL (ref 31.5–36.0)
MCV: 88.3 fL (ref 79.5–101.0)
MONO#: 0.5 10*3/uL (ref 0.1–0.9)
MONO%: 7.5 % (ref 0.0–14.0)
NEUT#: 4.1 10*3/uL (ref 1.5–6.5)
NEUT%: 65.4 % (ref 38.4–76.8)
Platelets: 153 10*3/uL (ref 145–400)
RBC: 4.96 10*6/uL (ref 3.70–5.45)
RDW: 14 % (ref 11.2–14.5)
WBC: 6.3 10*3/uL (ref 3.9–10.3)
lymph#: 1.5 10*3/uL (ref 0.9–3.3)

## 2015-06-04 LAB — COMPREHENSIVE METABOLIC PANEL
ALT: 18 U/L (ref 0–55)
AST: 19 U/L (ref 5–34)
Albumin: 4.4 g/dL (ref 3.5–5.0)
Alkaline Phosphatase: 97 U/L (ref 40–150)
Anion Gap: 11 mEq/L (ref 3–11)
BUN: 17.1 mg/dL (ref 7.0–26.0)
CO2: 26 mEq/L (ref 22–29)
Calcium: 10.3 mg/dL (ref 8.4–10.4)
Chloride: 103 mEq/L (ref 98–109)
Creatinine: 0.8 mg/dL (ref 0.6–1.1)
EGFR: 89 mL/min/{1.73_m2} — ABNORMAL LOW (ref >90)
Glucose: 89 mg/dl (ref 70–140)
Potassium: 3.7 mEq/L (ref 3.5–5.1)
Sodium: 140 mEq/L (ref 136–145)
Total Bilirubin: 0.73 mg/dL (ref 0.20–1.20)
Total Protein: 8.2 g/dL (ref 6.4–8.3)

## 2015-06-04 NOTE — Telephone Encounter (Signed)
  Oncology Nurse Navigator Documentation  Navigator Location: [facility] (06/04/15 1023) Navigator Encounter Type: Telephone (06/04/15 1023)  I called patient to check in.  She reports that she is doing well with no questions or concerns at this time.  We reviewed her upcoming appointments at [facility].  I encouraged her to call me for any needs.           Treatment Phase: Follow-up (06/04/15 1023) Barriers/Navigation Needs: No Questions;No Needs;No barriers at this time (06/04/15 1023)   Interventions: None required (06/04/15 1023)            Acuity: Level 1 (06/04/15 1023) Acuity Level 1: Minimal follow up required (06/04/15 1023)       Time Spent with Patient: 15 (06/04/15 1023)

## 2015-06-05 LAB — VITAMIN D 25 HYDROXY (VIT D DEFICIENCY, FRACTURES): Vitamin D, 25-Hydroxy: 26.8 ng/mL — ABNORMAL LOW (ref 30.0–100.0)

## 2015-06-05 LAB — PTH, INTACT AND CALCIUM
Calcium, Ser: 10 mg/dL (ref 8.7–10.3)
PTH, Intact: 33 pg/mL (ref 15–65)

## 2015-06-11 ENCOUNTER — Encounter: Payer: 59 | Admitting: Hematology

## 2015-06-11 ENCOUNTER — Encounter: Payer: Self-pay | Admitting: *Deleted

## 2015-06-11 ENCOUNTER — Other Ambulatory Visit: Payer: Self-pay | Admitting: *Deleted

## 2015-06-11 ENCOUNTER — Other Ambulatory Visit: Payer: 59

## 2015-06-11 NOTE — Progress Notes (Signed)
No show  This encounter was created in error - please disregard.

## 2015-06-11 NOTE — Progress Notes (Signed)
Erroneous encounter

## 2015-06-15 ENCOUNTER — Telehealth: Payer: Self-pay | Admitting: Hematology

## 2015-06-15 NOTE — Telephone Encounter (Signed)
Spoke with patient re new appointment for lab/fu 3/6 @ 2:30 pm. Rescheduled from 2/17 per 2/17 pof.

## 2015-06-28 ENCOUNTER — Other Ambulatory Visit (HOSPITAL_BASED_OUTPATIENT_CLINIC_OR_DEPARTMENT_OTHER): Payer: 59

## 2015-06-28 ENCOUNTER — Encounter: Payer: Self-pay | Admitting: Hematology

## 2015-06-28 ENCOUNTER — Telehealth: Payer: Self-pay | Admitting: Hematology

## 2015-06-28 ENCOUNTER — Encounter: Payer: Self-pay | Admitting: *Deleted

## 2015-06-28 ENCOUNTER — Ambulatory Visit (HOSPITAL_BASED_OUTPATIENT_CLINIC_OR_DEPARTMENT_OTHER): Payer: 59 | Admitting: Hematology

## 2015-06-28 VITALS — BP 145/83 | HR 84 | Temp 98.5°F | Resp 17 | Ht 67.75 in | Wt 160.1 lb

## 2015-06-28 DIAGNOSIS — C50911 Malignant neoplasm of unspecified site of right female breast: Secondary | ICD-10-CM | POA: Diagnosis not present

## 2015-06-28 DIAGNOSIS — C50511 Malignant neoplasm of lower-outer quadrant of right female breast: Secondary | ICD-10-CM

## 2015-06-28 DIAGNOSIS — C50519 Malignant neoplasm of lower-outer quadrant of unspecified female breast: Secondary | ICD-10-CM

## 2015-06-28 DIAGNOSIS — M8580 Other specified disorders of bone density and structure, unspecified site: Secondary | ICD-10-CM | POA: Diagnosis not present

## 2015-06-28 LAB — COMPREHENSIVE METABOLIC PANEL
ALT: 18 U/L (ref 0–55)
AST: 17 U/L (ref 5–34)
Albumin: 4.4 g/dL (ref 3.5–5.0)
Alkaline Phosphatase: 84 U/L (ref 40–150)
Anion Gap: 11 mEq/L (ref 3–11)
BUN: 14.8 mg/dL (ref 7.0–26.0)
CO2: 25 mEq/L (ref 22–29)
Calcium: 10.4 mg/dL (ref 8.4–10.4)
Chloride: 107 mEq/L (ref 98–109)
Creatinine: 0.9 mg/dL (ref 0.6–1.1)
EGFR: 80 mL/min/{1.73_m2} — ABNORMAL LOW (ref >90)
Glucose: 90 mg/dl (ref 70–140)
Potassium: 3.8 mEq/L (ref 3.5–5.1)
Sodium: 143 mEq/L (ref 136–145)
Total Bilirubin: 0.35 mg/dL (ref 0.20–1.20)
Total Protein: 8.1 g/dL (ref 6.4–8.3)

## 2015-06-28 LAB — CBC WITH DIFFERENTIAL/PLATELET
BASO%: 0.8 % (ref 0.0–2.0)
Basophils Absolute: 0.1 10*3/uL (ref 0.0–0.1)
EOS%: 2.2 % (ref 0.0–7.0)
Eosinophils Absolute: 0.2 10*3/uL (ref 0.0–0.5)
HCT: 41.6 % (ref 34.8–46.6)
HGB: 13.4 g/dL (ref 11.6–15.9)
LYMPH%: 22.5 % (ref 14.0–49.7)
MCH: 28.3 pg (ref 25.1–34.0)
MCHC: 32.3 g/dL (ref 31.5–36.0)
MCV: 87.6 fL (ref 79.5–101.0)
MONO#: 0.6 10*3/uL (ref 0.1–0.9)
MONO%: 7.8 % (ref 0.0–14.0)
NEUT#: 5.1 10*3/uL (ref 1.5–6.5)
NEUT%: 66.7 % (ref 38.4–76.8)
Platelets: 188 10*3/uL (ref 145–400)
RBC: 4.74 10*6/uL (ref 3.70–5.45)
RDW: 13.8 % (ref 11.2–14.5)
WBC: 7.6 10*3/uL (ref 3.9–10.3)
lymph#: 1.7 10*3/uL (ref 0.9–3.3)

## 2015-06-28 MED ORDER — ANASTROZOLE 1 MG PO TABS: ORAL_TABLET | ORAL | Status: DC

## 2015-06-28 NOTE — Progress Notes (Signed)
  Oncology Nurse Navigator Documentation  Navigator Location: [facility] (06/28/15 1500) Navigator Encounter Type: Follow-up Appt (06/28/15 1500)  Patient at [facility] for routine follow up appt with Dr. Burr Medico.  Patient reports that she is doing well.  She continues to care for her mother who is in the final stages of altzheimer's disease.  Patient a little tearful and feeling a little abandoned by others in the care of her mother.  I told her about counselors here at [facility] and offered her information.  Patient interested so I gave her information about the 2 [employer] counselors with contact information.  Patient denied any other questions or concerns at this time.  I encouraged her to call me if she needs any assistance to set up appointment with counselor or if she has any other needs.         Patient Visit Type: MedOnc (06/28/15 1500) Treatment Phase: Follow-up (06/28/15 1500) Barriers/Navigation Needs: Family concerns (06/28/15 1500)   Interventions: Other (Gave patient information about counselors at [facility]) (06/28/15 1500)            Acuity: Level 1 (06/28/15 1500) Acuity Level 1: Minimal follow up required (06/28/15 1500)       Time Spent with Patient: 15 (06/28/15 1500)

## 2015-06-28 NOTE — Telephone Encounter (Signed)
Gave and printed appt shced and avs for pt for Sept  °

## 2015-06-28 NOTE — Progress Notes (Signed)
[facility]  OFFICE PROGRESS NOTE  CC  Sharon Austria, MD [address] [place] [address] Dr. Alphonsa Overall  Dr. Arloa Koh   DIAGNOSIS: 60 year old female with diagnosis of invasive ductal carcinoma of the right breast (T1 N0) for f/u.  STAGE:  Cancer of lower-outer quadrant of female breast  Primary site: Breast (Right)  Staging method: AJCC 7th Edition  Clinical: Stage IA (T1c, N0, cM0)  Summary: Stage IA (T1c, N0, cM0)   PRIOR THERAPY: #1On 04/30/2012 patient had a short interval followup of a nodule along the right lower quadrant of the posterior right breast. Interval nodule was seen again along the right lower quadrant. She had an ultrasound done that showed a cyst at the 6:00 position and a 4 x 3 x 4 mm hypoechoic nodule at the 8:00 position 8 cm from the nipple. This was felt to be smaller than a nodule noted in the mammogram and did not correlate to the mammographic findings  #2 Patient had a stereotactic biopsy performed on 06/10/2012. The pathology showed a invasive mammary carcinoma with extravasated mucin. Tumor was low grade, ER positive PR +100% with a proliferation marker Ki-67 of 22%.  #3 s/p right needle loc lumpectomy with right axillary sentinel lymph node biopsy on 08/20/12 with path  1.0 cm IDC with extravasated mucin, 0/3 nodes, ER 100%, PR 100% Her2Neu neg, Ki-67 22%  #3 Oncotype Dx performed on 5/14 with recurrnece score of 13 and distant recurrence rate of 8% , in the low risk category.  #4  Patient underwent adjuvant radiation therapy from 10/01/12 through 10/31/12.    #5 Adjuvant Arimidex starting 12/20/12  CURRENT THERAPY: Arimidex daily  INTERVAL HISTORY: Sharon Nguyen 60 y.o. female returns for follow up her breast cancer. I saw her 6 months ago last time. She is doing well overall, denies any significant pain, dyspnea, or other discomfort. She has been compliant with Arimidex, and tolerated well. No significant  hot flashes or other side effects. She does not sleep well, and external p.m. at bedtime.  MEDICAL HISTORY: Past Medical History  Diagnosis Date  . Hypertension   . Hepatitis C   . TB (pulmonary tuberculosis)   . GERD (gastroesophageal reflux disease)   . Status post radiation therapy 10/01/12-10/31/12    rt breast/4250cGy 17 sessions/ rt breat boost=750cGy /3 sessions  . Breast cancer ([place])     right 2014    ALLERGIES:  is allergic to procardia.  MEDICATIONS:  Current Outpatient Prescriptions  Medication Sig Dispense Refill  . ALPRAZolam (XANAX) 0.25 MG tablet Take 1 tablet (0.25 mg total) by mouth at bedtime as needed for anxiety. 15 tablet 0  . amLODipine (NORVASC) 10 MG tablet Take 10 mg by mouth daily.    Marland Kitchen anastrozole (ARIMIDEX) 1 MG tablet TAKE 1 TABLET BY MOUTH DAILY. 90 tablet 3  . Calcium Carb-Cholecalciferol (CALCIUM 600 + D) 600-200 MG-UNIT TABS Take 1 tablet by mouth daily.    . fish oil-omega-3 fatty acids 1000 MG capsule Take 1 g by mouth daily.    Marland Kitchen losartan-hydrochlorothiazide (HYZAAR) 100-25 MG per tablet Take 1 tablet by mouth daily.    . Multiple Vitamin (MULTIVITAMIN WITH MINERALS) TABS Take 1 tablet by mouth daily.     No current facility-administered medications for this visit.     SURGICAL HISTORY:  Past Surgical History  Procedure Laterality Date  . Thyroid cyst excision    . Tubal ligation    . Mandible fracture surgery    .  Breast lumpectomy with needle localization and axillary sentinel lymph node bx Right 08/20/2012    Procedure: BREAST LUMPECTOMY WITH NEEDLE LOCALIZATION AND AXILLARY SENTINEL LYMPH NODE BX;  Surgeon: Shann Medal, MD;  Location: [facility];  Service: General;  Laterality: Right;  . Breast surgery      REVIEW OF SYSTEMS:  A 10 point review of systems was conducted and is otherwise negative except for what is noted above.    Health Maintenance  Mammogram:  10/20/2013 Colonoscopy: 2011 Bone Density Scan: baseline I am setting up in  August 2015. Pap Smear: 02/2011 Eye Exam: 12/2012 Vitamin D Level: done Lipid Panel: 2013   PHYSICAL EXAMINATION: Blood pressure 145/83, pulse 84, temperature 98.5 F (36.9 C), temperature source Oral, resp. rate 17, height 5' 7.75" (1.721 m), weight 160 lb 1.6 oz (72.621 kg), SpO2 100 %. There is no weight on file to calculate BMI. General: Patient is a well appearing female in no acute distress HEENT: PERRLA, sclerae anicteric no conjunctival pallor, MMM Neck: supple, no palpable adenopathy Lungs: clear to auscultation bilaterally, no wheezes, rhonchi, or rales Cardiovascular: regular rate rhythm, S1, S2, no murmurs, rubs or gallops Abdomen: Soft, non-tender, non-distended, normoactive bowel sounds, no HSM Extremities: warm and well perfused, no clubbing, cyanosis, or edema Skin: No rashes or lesions Neuro: Non-focal Breasts: Right lumpectomy site without nodularity, hyperpigmentation to right chest wall.  Left breast no nodules or masses. No adenopathy in either axilla and no cervical or supraclavicular adenopathy noted. ECOG PERFORMANCE STATUS: 0 - Asymptomatic    LABORATORY DATA: CBC Latest Ref Rng 06/28/2015 06/04/2015 12/10/2014  WBC 3.9 - 10.3 10e3/uL 7.6 6.3 6.5  Hemoglobin 11.6 - 15.9 g/dL 13.4 14.0 13.4  Hematocrit 34.8 - 46.6 % 41.6 43.8 40.5  Platelets 145 - 400 10e3/uL 188 153 155    CMP Latest Ref Rng 06/28/2015 06/04/2015 06/04/2015  Glucose 70 - 140 mg/dl 90 89 -  BUN 7.0 - 26.0 mg/dL 14.8 17.1 -  Creatinine 0.6 - 1.1 mg/dL 0.9 0.8 -  Sodium 136 - 145 mEq/L 143 140 -  Potassium 3.5 - 5.1 mEq/L 3.8 3.7 -  CO2 22 - 29 mEq/L 25 26 -  Calcium 8.4 - 10.4 mg/dL 10.4 10.0 10.3  Total Protein 6.4 - 8.3 g/dL 8.1 8.2 -  Total Bilirubin 0.20 - 1.20 mg/dL 0.35 0.73 -  Alkaline Phos 40 - 150 U/L 84 97 -  AST 5 - 34 U/L 17 19 -  ALT 0 - 55 U/L 18 18 -     RADIOGRAPHIC STUDIES:  Mammogram 12/08/2014 IMPRESSION: No evidence of recurrent or new breast malignancy.  Benign postsurgical changes on the right.  Bone density scan 12/30/2013: T-Score: -1.2 (lumber) and -1.7 Femur neck. LOW BONE MASS by WHO Criteria.   ASSESSMENT: 60 year old female with   1. Stage I (T1N0) IDC s/p lumpectomy April 2014 with Alphonsa Overall and SLN, ER 100%, PR 100% Her2Neu neg, Ki-67 22% -She is clinically doing well. Physical exam and recent mammogram showed no evidence of recurrence. -She will continue anastrozole, a total 5 years adjuvant therapy. She asked about if she needs to take anastrozole for a total of 10 years, giving her early stage disease, I think it 5 years is probably adequate. We do have recent clinical data that 10 years of aromatase inhibitor decreases disease free survival, however no benefit of overall survival. -Lab reviewed, normal CBC and CMP. She is clinically doing well, her physical exam and lost mammogram showed no  evidence of recurrence, -We'll continue anastrozole -I encouraged her to continue healthy diet and exercise regularly.   2. Bone health -Her last bone density scan in September 2015 showed osteopenia. -Her recent vitamin D level was mildly low, I encouraged her to increase vitamin D supplement to 2000u daily, and exercise regularly. Due to her mild hypercalcemia, she has been off calcium supplement. -Next bone density scan in September 2017.  3. Hypercalcemia -Her PTH was normal. I encouraged her to drink more water, we'll follow-up her calcium level.    PLAN:  -RTC in 6 month with lab, including CBC, CMP -Diagnostic mammogram in August 2017, bone density scan in September 2017   I spent 15 minutes counseling the patient face to face. The total time spent in the appointment was 20 minutes.  Truitt Merle 06/28/2015

## 2015-07-16 ENCOUNTER — Telehealth: Payer: Self-pay | Admitting: Hematology

## 2015-07-16 NOTE — Telephone Encounter (Signed)
lvm for pt regarding to 8.21 appt.Marland KitchenMarland KitchenMarland Kitchen

## 2015-10-12 ENCOUNTER — Other Ambulatory Visit: Payer: Self-pay

## 2015-10-12 ENCOUNTER — Telehealth: Payer: Self-pay | Admitting: *Deleted

## 2015-10-12 DIAGNOSIS — Z1231 Encounter for screening mammogram for malignant neoplasm of breast: Secondary | ICD-10-CM

## 2015-10-12 NOTE — Telephone Encounter (Signed)
Received vm call from pt stating that she fell on her R breast where she had cancer & needs a diagnostic mammogram & U/S.  Returned call & she states that [facility] recommended this.  She was not having any problems with her breast before falling today & hit her breast on the handle bars of a bike when she fell.  She is very anxious. Called & verified with [facility] that they did suggest this. Discussed with Dr Irene Limbo & agreed that she will probably bruise from the fall & to apply ice 1st 24 hours & heat thereafter to help bruising go away but shouldn't need mammogram/U/S unless something is worse.  She should give bruise time to heal.  Left message on pt's vm per his message.

## 2015-12-13 ENCOUNTER — Ambulatory Visit
Admission: RE | Admit: 2015-12-13 | Discharge: 2015-12-13 | Disposition: A | Discharge status: Home / Self Care | Payer: 59 | Source: Ambulatory Visit | Attending: Hematology | Admitting: Hematology

## 2015-12-13 DIAGNOSIS — C50511 Malignant neoplasm of lower-outer quadrant of right female breast: Secondary | ICD-10-CM

## 2015-12-13 DIAGNOSIS — C50519 Malignant neoplasm of lower-outer quadrant of unspecified female breast: Secondary | ICD-10-CM

## 2016-01-03 ENCOUNTER — Other Ambulatory Visit (HOSPITAL_BASED_OUTPATIENT_CLINIC_OR_DEPARTMENT_OTHER): Payer: 59

## 2016-01-03 ENCOUNTER — Encounter: Payer: Self-pay | Admitting: *Deleted

## 2016-01-03 ENCOUNTER — Telehealth: Payer: Self-pay | Admitting: Hematology

## 2016-01-03 ENCOUNTER — Ambulatory Visit (HOSPITAL_BASED_OUTPATIENT_CLINIC_OR_DEPARTMENT_OTHER): Payer: 59 | Admitting: Hematology

## 2016-01-03 ENCOUNTER — Encounter: Payer: Self-pay | Admitting: Hematology

## 2016-01-03 VITALS — BP 135/82 | HR 77 | Temp 98.2°F | Resp 17 | Ht 67.75 in | Wt 154.7 lb

## 2016-01-03 DIAGNOSIS — B1920 Unspecified viral hepatitis C without hepatic coma: Secondary | ICD-10-CM | POA: Diagnosis not present

## 2016-01-03 DIAGNOSIS — C50511 Malignant neoplasm of lower-outer quadrant of right female breast: Secondary | ICD-10-CM | POA: Diagnosis not present

## 2016-01-03 DIAGNOSIS — C50519 Malignant neoplasm of lower-outer quadrant of unspecified female breast: Secondary | ICD-10-CM

## 2016-01-03 LAB — COMPREHENSIVE METABOLIC PANEL
ALT: 20 U/L (ref 0–55)
AST: 18 U/L (ref 5–34)
Albumin: 4.2 g/dL (ref 3.5–5.0)
Alkaline Phosphatase: 101 U/L (ref 40–150)
Anion Gap: 9 mEq/L (ref 3–11)
BUN: 15.1 mg/dL (ref 7.0–26.0)
CO2: 26 mEq/L (ref 22–29)
Calcium: 10.5 mg/dL — ABNORMAL HIGH (ref 8.4–10.4)
Chloride: 107 mEq/L (ref 98–109)
Creatinine: 1 mg/dL (ref 0.6–1.1)
EGFR: 74 mL/min/{1.73_m2} — ABNORMAL LOW (ref >90)
Glucose: 86 mg/dl (ref 70–140)
Potassium: 4.2 mEq/L (ref 3.5–5.1)
Sodium: 142 mEq/L (ref 136–145)
Total Bilirubin: 0.42 mg/dL (ref 0.20–1.20)
Total Protein: 8.1 g/dL (ref 6.4–8.3)

## 2016-01-03 LAB — CBC WITH DIFFERENTIAL/PLATELET
BASO%: 0.4 % (ref 0.0–2.0)
Basophils Absolute: 0 10*3/uL (ref 0.0–0.1)
EOS%: 2.5 % (ref 0.0–7.0)
Eosinophils Absolute: 0.2 10*3/uL (ref 0.0–0.5)
HCT: 40.8 % (ref 34.8–46.6)
HGB: 13.2 g/dL (ref 11.6–15.9)
LYMPH%: 24.9 % (ref 14.0–49.7)
MCH: 28.8 pg (ref 25.1–34.0)
MCHC: 32.4 g/dL (ref 31.5–36.0)
MCV: 89.1 fL (ref 79.5–101.0)
MONO#: 0.5 10*3/uL (ref 0.1–0.9)
MONO%: 6.5 % (ref 0.0–14.0)
NEUT#: 4.7 10*3/uL (ref 1.5–6.5)
NEUT%: 65.7 % (ref 38.4–76.8)
Platelets: 156 10*3/uL (ref 145–400)
RBC: 4.58 10*6/uL (ref 3.70–5.45)
RDW: 13.9 % (ref 11.2–14.5)
WBC: 7.2 10*3/uL (ref 3.9–10.3)
lymph#: 1.8 10*3/uL (ref 0.9–3.3)

## 2016-01-03 NOTE — Telephone Encounter (Signed)
DEXA scheduled with [facility] on 02/03/16 @ 2:30 p.m. Per 01/03/16 los.  Avs report and appt schd given per 01/03/16 los.

## 2016-01-03 NOTE — Progress Notes (Signed)
  Oncology Nurse Navigator Documentation  Navigator Location: [facility]Med Onc (01/03/16 1500) Navigator Encounter Type: Follow-up Appt (01/03/16 1500)  Patient at [facility] for routine follow up visit with Dr. Burr Medico.  Patient reports that she has been doing well.  She continues to miss her husband and sometimes finds coming to the [place] difficult.  She recently took a day trip that she and her husband had wanted to take together and she said it was good for her.  She has started regular exercise.  She cares for her mother with dementia and has started to go to a support group for caregivers.  She still has information I gave her about counselors at [facility] and may schedule in the future.  We talked about how a friend has recurrent breast cancer and how you are never free of the concern.  She denied any questions or concerns at this time.  I encouraged her to call me for any needs.         Patient Visit Type: MedOnc (01/03/16 1500) Treatment Phase: Follow-up (01/03/16 1500) Barriers/Navigation Needs: No barriers at this time (01/03/16 1500)   Interventions: None required (01/03/16 1500)                      Time Spent with Patient: 15 (01/03/16 1500)

## 2016-01-03 NOTE — Progress Notes (Signed)
[place]  OFFICE PROGRESS NOTE  CC  [name], MD [address] [place] [address] Dr. Alphonsa Overall  Dr. Arloa Koh   DIAGNOSIS: 60 year old female with diagnosis of invasive ductal carcinoma of the right breast (T1 N0) for f/u.  STAGE:  Cancer of lower-outer quadrant of female breast  Primary site: Breast (Right)  Staging method: AJCC 7th Edition  Clinical: Stage IA (T1c, N0, cM0)  Summary: Stage IA (T1c, N0, cM0)   PRIOR THERAPY: #1On 04/30/2012 patient had a short interval followup of a nodule along the right lower quadrant of the posterior right breast. Interval nodule was seen again along the right lower quadrant. She had an ultrasound done that showed a cyst at the 6:00 position and a 4 x 3 x 4 mm hypoechoic nodule at the 8:00 position 8 cm from the nipple. This was felt to be smaller than a nodule noted in the mammogram and did not correlate to the mammographic findings  #2 Patient had a stereotactic biopsy performed on 06/10/2012. The pathology showed a invasive mammary carcinoma with extravasated mucin. Tumor was low grade, ER positive PR +100% with a proliferation marker Ki-67 of 22%.  #3 s/p right needle loc lumpectomy with right axillary sentinel lymph node biopsy on 08/20/12 with path  1.0 cm IDC with extravasated mucin, 0/3 nodes, ER 100%, PR 100% Her2Neu neg, Ki-67 22%  #3 Oncotype Dx performed on 5/14 with recurrnece score of 13 and distant recurrence rate of 8% , in the low risk category.  #4  Patient underwent adjuvant radiation therapy from 10/01/12 through 10/31/12.    #5 Adjuvant Arimidex starting 12/20/12  CURRENT THERAPY: Arimidex daily  INTERVAL HISTORY: Sharon Nguyen 60 y.o. female returns for follow up her breast cancer. I saw her 6 months ago last time. She is doing very well. She is compliant with anastrozole, and tolerates very well. She denies significant hot flash, muscular or joint discomfort, or other  side effects. She has been physically active, does weight bearing exercise regularly. She has good appetite and energy level, weight is stable, no other complaints. One of her friends had recurrent metastatic cancer 6 years after her initial surgery, she is concerned about the risk of cancer recurrence in the future.    MEDICAL HISTORY: Past Medical History:  Diagnosis Date  . Breast cancer ([place])    right 2014  . GERD (gastroesophageal reflux disease)   . Hepatitis C   . Hypertension   . Status post radiation therapy 10/01/12-10/31/12   rt breast/4250cGy 17 sessions/ rt breat boost=750cGy /3 sessions  . TB (pulmonary tuberculosis)     ALLERGIES:  is allergic to procardia [nifedipine].  MEDICATIONS:  Current Outpatient Prescriptions  Medication Sig Dispense Refill  . amLODipine (NORVASC) 10 MG tablet Take 10 mg by mouth daily.    Marland Kitchen anastrozole (ARIMIDEX) 1 MG tablet TAKE 1 TABLET BY MOUTH DAILY. 90 tablet 1  . Calcium Carb-Cholecalciferol (CALCIUM 600 + D) 600-200 MG-UNIT TABS Take 1 tablet by mouth daily.    . fish oil-omega-3 fatty acids 1000 MG capsule Take 1 g by mouth daily.    Marland Kitchen losartan-hydrochlorothiazide (HYZAAR) 100-25 MG per tablet Take 1 tablet by mouth daily.    . Multiple Vitamin (MULTIVITAMIN WITH MINERALS) TABS Take 1 tablet by mouth daily.    Marland Kitchen ALPRAZolam (XANAX) 0.25 MG tablet Take 1 tablet (0.25 mg total) by mouth at bedtime as needed for anxiety. (Patient not taking: Reported on 06/28/2015) 15  tablet 0   No current facility-administered medications for this visit.      SURGICAL HISTORY:  Past Surgical History:  Procedure Laterality Date  . BREAST LUMPECTOMY WITH NEEDLE LOCALIZATION AND AXILLARY SENTINEL LYMPH NODE BX Right 08/20/2012   Procedure: BREAST LUMPECTOMY WITH NEEDLE LOCALIZATION AND AXILLARY SENTINEL LYMPH NODE BX;  Surgeon: Shann Medal, MD;  Location: [place] [facility];  Service: General;  Laterality: Right;  . BREAST SURGERY    . MANDIBLE FRACTURE SURGERY    .  THYROID CYST EXCISION    . TUBAL LIGATION      REVIEW OF SYSTEMS:  A 10 point review of systems was conducted and is otherwise negative except for what is noted above.    Health Maintenance  Mammogram:  10/20/2013 Colonoscopy: 2011 Bone Density Scan: baseline I am setting up in August 2015. Pap Smear: 02/2011 Eye Exam: 12/2012 Vitamin D Level: done Lipid Panel: 2013   PHYSICAL EXAMINATION: Blood pressure 135/82, pulse 77, temperature 98.2 F (36.8 C), temperature source Oral, resp. rate 17, height 5' 7.75" (1.721 m), weight 154 lb 11.2 oz (70.2 kg), SpO2 100 %. Body mass index is 23.7 kg/m. General: Patient is a well appearing female in no acute distress HEENT: PERRLA, sclerae anicteric no conjunctival pallor, MMM Neck: supple, no palpable adenopathy Lungs: clear to auscultation bilaterally, no wheezes, rhonchi, or rales Cardiovascular: regular rate rhythm, S1, S2, no murmurs, rubs or gallops Abdomen: Soft, non-tender, non-distended, normoactive bowel sounds, no HSM Extremities: warm and well perfused, no clubbing, cyanosis, or edema Skin: No rashes or lesions Neuro: Non-focal Breasts: Right lumpectomy site without nodularity, hyperpigmentation to right chest wall.  Left breast no nodules or masses. No adenopathy in either axilla and no cervical or supraclavicular adenopathy noted. ECOG PERFORMANCE STATUS: 0 - Asymptomatic    LABORATORY DATA: CBC Latest Ref Rng & Units 01/03/2016 06/28/2015 06/04/2015  WBC 3.9 - 10.3 10e3/uL 7.2 7.6 6.3  Hemoglobin 11.6 - 15.9 g/dL 13.2 13.4 14.0  Hematocrit 34.8 - 46.6 % 40.8 41.6 43.8  Platelets 145 - 400 10e3/uL 156 188 153    CMP Latest Ref Rng & Units 01/03/2016 06/28/2015 06/04/2015  Glucose 70 - 140 mg/dl 86 90 89  BUN 7.0 - 26.0 mg/dL 15.1 14.8 17.1  Creatinine 0.6 - 1.1 mg/dL 1.0 0.9 0.8  Sodium 136 - 145 mEq/L 142 143 140  Potassium 3.5 - 5.1 mEq/L 4.2 3.8 3.7  Chloride 98 - 107 mEq/L - - -  CO2 22 - 29 mEq/L '26 25 26  ' Calcium 8.4 -  10.4 mg/dL 10.5(H) 10.4 10.0  Total Protein 6.4 - 8.3 g/dL 8.1 8.1 8.2  Total Bilirubin 0.20 - 1.20 mg/dL 0.42 0.35 0.73  Alkaline Phos 40 - 150 U/L 101 84 97  AST 5 - 34 U/L '18 17 19  ' ALT 0 - 55 U/L '20 18 18     ' RADIOGRAPHIC STUDIES:  Mammogram 12/13/2015 IMPRESSION: No findings worrisome for recurrent tumor or developing malignancy.  RECOMMENDATION: Bilateral diagnostic mammography in 1 year.   Bone density scan 12/30/2013: T-Score: -1.2 (lumber) and -1.7 Femur neck. LOW BONE MASS by WHO Criteria.   ASSESSMENT: 60 year old female with   1. Stage I (T1N0) IDC s/p lumpectomy April 2014 with Alphonsa Overall and SLN, ER 100%, PR 100% Her2Neu neg, Ki-67 22%, Oncotype RS 13 -Her Oncotype her Oncotype recurrence score was 13, low risk, predicts 10 year distant recurrence rate of 8% with tamoxifen -She will continue anastrozole, a total 5-10 years adjuvant therapy.  She asked about if she needs to take anastrozole for a total of 10 years, giving her early stage and low risk disease, I think it 5 years is probably adequate. We do have recent clinical data that 10 years of aromatase inhibitor decreases disease free survival, however no benefit of overall survival. -She is concerned about the recurrence, after her friend had recurrent breast cancer 6 years out of her initial surgery. She is tolerating anastrozole very well, I think is okay if she wants to continue anastrozole beyond 5 years for up to 10 years if she continues tolerating anastrozole well. -Lab reviewed, normal CBC and CMP. She is clinically doing well, her physical exam and lost mammogram showed no evidence of recurrence, -We'll continue anastrozole -I encouraged her to continue healthy diet and exercise regularly.   2. Osteopenia -Her last bone density scan in September 2015 showed osteopenia. -We discussed that anastrozole can weak of bone, increase her risk of fracture -Her recent vitamin D level was mildly low, I  encouraged her to increase vitamin D supplement to 2000u daily, and exercise regularly, she is compliant, does weight. Exercise. Due to her mild hypercalcemia, she has been off calcium supplement. -Next bone density scan in September 2017. I'll scheduled for her   3. Hypercalcemia -Her PTH was normal. I encouraged her to drink more water, we'll follow-up her calcium level.   4. HTN, Hep C -She'll continue follow-up with her primary care physician  PLAN:  -RTC in 6 month with lab, including CBC, CMP -Bone density scan at GI in September 2017   I spent 15 minutes counseling the patient face to face. The total time spent in the appointment was 20 minutes.  Truitt Merle 01/03/2016

## 2016-01-18 ENCOUNTER — Other Ambulatory Visit: Payer: Self-pay | Admitting: Hematology

## 2016-01-18 DIAGNOSIS — C50519 Malignant neoplasm of lower-outer quadrant of unspecified female breast: Secondary | ICD-10-CM

## 2016-02-03 ENCOUNTER — Ambulatory Visit
Admission: RE | Admit: 2016-02-03 | Discharge: 2016-02-03 | Disposition: A | Discharge status: Home / Self Care | Payer: 59 | Source: Ambulatory Visit | Attending: Hematology | Admitting: Hematology

## 2016-02-03 DIAGNOSIS — M8580 Other specified disorders of bone density and structure, unspecified site: Secondary | ICD-10-CM

## 2016-04-23 ENCOUNTER — Other Ambulatory Visit: Payer: Self-pay | Admitting: Hematology

## 2016-04-23 DIAGNOSIS — C50519 Malignant neoplasm of lower-outer quadrant of unspecified female breast: Secondary | ICD-10-CM

## 2016-07-03 ENCOUNTER — Telehealth: Payer: Self-pay | Admitting: Hematology

## 2016-07-03 ENCOUNTER — Encounter: Payer: 59 | Admitting: Hematology

## 2016-07-03 ENCOUNTER — Other Ambulatory Visit: Payer: 59

## 2016-07-03 NOTE — Progress Notes (Signed)
[facility]  OFFICE PROGRESS NOTE  CC  Sharon Austria, MD [place] [id number] Dr. Alphonsa Overall  Dr. Arloa Koh   DIAGNOSIS: 61 year old female with diagnosis of invasive ductal carcinoma of the right breast (T1 N0) for f/u.  STAGE:  Cancer of lower-outer quadrant of female breast  Primary site: Breast (Right)  Staging method: AJCC 7th Edition  Clinical: Stage IA (T1c, N0, cM0)  Summary: Stage IA (T1c, N0, cM0)   PRIOR THERAPY: #1On 04/30/2012 patient had a short interval followup of a nodule along the right lower quadrant of the posterior right breast. Interval nodule was seen again along the right lower quadrant. She had an ultrasound done that showed a cyst at the 6:00 position and a 4 x 3 x 4 mm hypoechoic nodule at the 8:00 position 8 cm from the nipple. This was felt to be smaller than a nodule noted in the mammogram and did not correlate to the mammographic findings  #2 Patient had a stereotactic biopsy performed on 06/10/2012. The pathology showed a invasive mammary carcinoma with extravasated mucin. Tumor was low grade, ER positive PR +100% with a proliferation marker Ki-67 of 22%.  #3 s/p right needle loc lumpectomy with right axillary sentinel lymph node biopsy on 08/20/12 with path  1.0 cm IDC with extravasated mucin, 0/3 nodes, ER 100%, PR 100% Her2Neu neg, Ki-67 22%  #3 Oncotype Dx performed on 5/14 with recurrnece score of 13 and distant recurrence rate of 8% , in the low risk category.  #4  Patient underwent adjuvant radiation therapy from 10/01/12 through 10/31/12.    #5 Adjuvant Arimidex starting 12/20/12  CURRENT THERAPY: Arimidex daily  INTERVAL HISTORY: Sharon Nguyen 61 y.o. female returns for follow up her breast cancer. She has been doing well. She is tolerating Anastrozole well. She has been exercising with a fitness trainer and has recently had a few headaches from it. Denies hot flashes, joint pain, or any  other concerns.   MEDICAL HISTORY: Past Medical History:  Diagnosis Date  . Breast cancer ([place])    right 2014  . GERD (gastroesophageal reflux disease)   . Hepatitis C   . Hypertension   . Status post radiation therapy 10/01/12-10/31/12   rt breast/4250cGy 17 sessions/ rt breat boost=750cGy /3 sessions  . TB (pulmonary tuberculosis)     ALLERGIES:  is allergic to procardia [nifedipine].  MEDICATIONS:  Current Outpatient Prescriptions  Medication Sig Dispense Refill  . amLODipine (NORVASC) 10 MG tablet Take 10 mg by mouth daily.    Marland Kitchen anastrozole (ARIMIDEX) 1 MG tablet TAKE 1 TABLET BY MOUTH DAILY. 90 tablet 1  . fish oil-omega-3 fatty acids 1000 MG capsule Take 1 g by mouth daily.    Marland Kitchen losartan-hydrochlorothiazide (HYZAAR) 100-25 MG per tablet Take 1 tablet by mouth daily.    . Multiple Vitamin (MULTIVITAMIN WITH MINERALS) TABS Take 1 tablet by mouth daily.    Marland Kitchen ALPRAZolam (XANAX) 0.25 MG tablet Take 1 tablet (0.25 mg total) by mouth at bedtime as needed for anxiety. (Patient not taking: Reported on 06/28/2015) 15 tablet 0   No current facility-administered medications for this visit.      SURGICAL HISTORY:  Past Surgical History:  Procedure Laterality Date  . BREAST LUMPECTOMY WITH NEEDLE LOCALIZATION AND AXILLARY SENTINEL LYMPH NODE BX Right 08/20/2012   Procedure: BREAST LUMPECTOMY WITH NEEDLE LOCALIZATION AND AXILLARY SENTINEL LYMPH NODE BX;  Surgeon: Shann Medal, MD;  Location: [place] [facility];  Service: General;  Laterality: Right;  . BREAST SURGERY    . MANDIBLE FRACTURE SURGERY    . THYROID CYST EXCISION    . TUBAL LIGATION      REVIEW OF SYSTEMS:   Review of Systems  Constitutional: Negative.   HENT: Negative.   Eyes: Negative.   Respiratory: Negative.   Cardiovascular: Negative.   Gastrointestinal: Negative.   Genitourinary: Negative.   Musculoskeletal: Negative.   Skin: Negative.   Neurological: Positive for headaches (muscular - from exercises).   Endo/Heme/Allergies: Negative.   Psychiatric/Behavioral: Negative.   All other systems reviewed and are negative. A 10 point review of systems was conducted and is otherwise negative except for what is noted above.    Health Maintenance  Mammogram:  10/20/2013 Colonoscopy: 2011 Bone Density Scan: baseline I am setting up in August 2015. Pap Smear: 02/2011 Eye Exam: 12/2012 Vitamin D Level: done Lipid Panel: 2013  PHYSICAL EXAMINATION:  Blood pressure 138/79, pulse 80, temperature 98.4 F (36.9 C), temperature source Oral, resp. rate 18, height 5' 7.75" (1.721 m), weight 158 lb 4.8 oz (71.8 kg), SpO2 100 %. Body mass index is 24.25 kg/m.   General: Patient is a well appearing female in no acute distress HEENT: PERRLA, sclerae anicteric no conjunctival pallor, MMM Neck: supple, no palpable adenopathy Lungs: clear to auscultation bilaterally, no wheezes, rhonchi, or rales Cardiovascular: regular rate rhythm, S1, S2, no murmurs, rubs or gallops Abdomen: Soft, non-tender, non-distended, normoactive bowel sounds, no HSM Extremities: warm and well perfused, no clubbing, cyanosis, or edema Skin: No rashes or lesions Neuro: Non-focal Breasts: Right lumpectomy site without nodularity, hyperpigmentation to right chest wall.  Left breast no nodules or masses. No adenopathy in either axilla and no cervical or supraclavicular adenopathy noted.  ECOG PERFORMANCE STATUS: 0 - Asymptomatic  LABORATORY DATA: CBC Latest Ref Rng & Units 07/10/2016 01/03/2016 06/28/2015  WBC 3.9 - 10.3 10e3/uL 6.5 7.2 7.6  Hemoglobin 11.6 - 15.9 g/dL 13.5 13.2 13.4  Hematocrit 34.8 - 46.6 % 41.2 40.8 41.6  Platelets 145 - 400 10e3/uL 144(L) 156 188    CMP Latest Ref Rng & Units 07/10/2016 01/03/2016 06/28/2015  Glucose 70 - 140 mg/dl 88 86 90  BUN 7.0 - 26.0 mg/dL 14.4 15.1 14.8  Creatinine 0.6 - 1.1 mg/dL 0.8 1.0 0.9  Sodium 136 - 145 mEq/L 142 142 143  Potassium 3.5 - 5.1 mEq/L 3.9 4.2 3.8  Chloride 98 - 107  mEq/L - - -  CO2 22 - 29 mEq/L _0 Calcium 8.4 - 10.4 mg/dL 10.5(H) 10.5(H) 10.4  Total Protein 6.4 - 8.3 g/dL 7.9 8.1 8.1  Total Bilirubin 0.20 - 1.20 mg/dL 0.65 0.42 0.35  Alkaline Phos 40 - 150 U/L 100 101 84  AST 5 - 34 U/L _1 ALT 0 - 55 U/L _2 RADIOGRAPHIC STUDIES:  Mammogram 12/13/2015 IMPRESSION: No findings worrisome for recurrent tumor or developing malignancy.  Bone density scan 02/03/2016:  The BMD measured at Femur Neck Right is 0.790 g/cm2 with a T-score of -1.8. This patient is considered osteopenic according to [facility]) criteria.  L- 3, L-4 were excluded due to degenerative changes. There has been no statistically significant change in BMD of left hip since prior exam dated 12/30/2013. Spine was not compared to prior study due to exclusion of vertebral bodies on current exam.  Site Region Measured Date Measured Age YA BMD Significant CHANGE T-score  AP Spine L1-L2 02/03/2016 62.6 -1.8 0.961  g/cm2  DualFemur Neck Right 02/03/2016 62.6 -1.8 0.790 g/cm2  World Health Organization [facility]) criteria for post-menopausal, Caucasian Women: Normal T-score at or above -1 SD Osteopenia T-score between -1 and -2.5 SD Osteoporosis T-score at or below -2.5 SD  ASSESSMENT: 61 y.o. female with   1. Stage I (T1N0) IDC ER 100%, PR 100% Her2Neu neg, Ki-67 22%, Oncotype RS 13 -Her Oncotype her Oncotype recurrence score was 13, low risk, predicts 10 year distant recurrence rate of 8% with tamoxifen -She will continue anastrozole, a total 5-10 years adjuvant therapy. She asked about if she needs to take anastrozole for a total of 10 years, giving her early stage and low risk disease, I think it 5 years is probably adequate. We do have recent clinical data that 10 years of aromatase inhibitor decreases disease free survival, however no benefit of overall survival. -She is concerned about the  recurrence, after her friend had recurrent breast cancer 6 years out of her initial surgery. She is tolerating anastrozole very well, I think is okay if she wants to continue anastrozole beyond 5 years for up to 7 years if she continues tolerating anastrozole well. -Lab reviewed, normal CBC and CMP. She is clinically doing well, her physical exam and lost mammogram showed no evidence of recurrence, -We'll continue anastrozole. She is tolerating well; -Next mammogram due in August 2018.  -I encouraged her to continue healthy diet and exercise regularly.   2. Osteopenia -Her last bone density scan in September 2015 showed osteopenia. -We again discussed that anastrozole can weak of bone, increase her risk of fracture -Her recent vitamin D level was mildly low, I encouraged her to increase vitamin D supplement to 2000u daily, and exercise regularly, she is compliant, does weight. Exercise. Due to her mild hypercalcemia, she has been off calcium supplement. -Last bone density scan was October 2017. Next due 01/2018  3. Hypercalcemia -Her PTH was normal. I encouraged her to drink more water, we'll follow-up her calcium level.   4. HTN, Hep C -She'll continue follow-up with her primary care physician  PLAN:  -Continue Anastrozole  -Order mammogram for August 2018.  -RTC in 6 month with lab, including CBC, CMP   I spent 15 minutes counseling the patient face to face. The total time spent in the appointment was 20 minutes.  This document serves as a record of services personally performed by Truitt Merle, MD. It was created on her behalf by Martinique Casey, a trained medical scribe. The creation of this record is based on the scribe's personal observations and the provider's statements to them. This document has been checked and approved by the attending provider.  I have reviewed the above documentation for accuracy and completeness and I agree with the above.  Truitt Merle 07/10/2016

## 2016-07-03 NOTE — Telephone Encounter (Signed)
Pt called to r/s appt to 3/19 at 230 pm

## 2016-07-09 NOTE — Progress Notes (Signed)
Open in error   This encounter was created in error - please disregard.  This encounter was created in error - please disregard.

## 2016-07-10 ENCOUNTER — Telehealth: Payer: Self-pay | Admitting: Hematology

## 2016-07-10 ENCOUNTER — Ambulatory Visit (HOSPITAL_BASED_OUTPATIENT_CLINIC_OR_DEPARTMENT_OTHER): Payer: BLUE CROSS/BLUE SHIELD | Admitting: Hematology

## 2016-07-10 ENCOUNTER — Other Ambulatory Visit (HOSPITAL_BASED_OUTPATIENT_CLINIC_OR_DEPARTMENT_OTHER): Payer: BLUE CROSS/BLUE SHIELD

## 2016-07-10 VITALS — BP 138/79 | HR 80 | Temp 98.4°F | Resp 18 | Ht 67.75 in | Wt 158.3 lb

## 2016-07-10 DIAGNOSIS — E559 Vitamin D deficiency, unspecified: Secondary | ICD-10-CM

## 2016-07-10 DIAGNOSIS — M8580 Other specified disorders of bone density and structure, unspecified site: Secondary | ICD-10-CM

## 2016-07-10 DIAGNOSIS — C50511 Malignant neoplasm of lower-outer quadrant of right female breast: Secondary | ICD-10-CM | POA: Diagnosis not present

## 2016-07-10 DIAGNOSIS — Z170 Estrogen receptor positive status [ER+]: Secondary | ICD-10-CM

## 2016-07-10 DIAGNOSIS — I10 Essential (primary) hypertension: Secondary | ICD-10-CM | POA: Diagnosis not present

## 2016-07-10 LAB — COMPREHENSIVE METABOLIC PANEL
ALT: 20 U/L (ref 0–55)
AST: 19 U/L (ref 5–34)
Albumin: 4.5 g/dL (ref 3.5–5.0)
Alkaline Phosphatase: 100 U/L (ref 40–150)
Anion Gap: 9 mEq/L (ref 3–11)
BUN: 14.4 mg/dL (ref 7.0–26.0)
CO2: 28 mEq/L (ref 22–29)
Calcium: 10.5 mg/dL — ABNORMAL HIGH (ref 8.4–10.4)
Chloride: 104 mEq/L (ref 98–109)
Creatinine: 0.8 mg/dL (ref 0.6–1.1)
EGFR: 88 mL/min/{1.73_m2} — ABNORMAL LOW (ref >90)
Glucose: 88 mg/dl (ref 70–140)
Potassium: 3.9 mEq/L (ref 3.5–5.1)
Sodium: 142 mEq/L (ref 136–145)
Total Bilirubin: 0.65 mg/dL (ref 0.20–1.20)
Total Protein: 7.9 g/dL (ref 6.4–8.3)

## 2016-07-10 LAB — CBC WITH DIFFERENTIAL/PLATELET
BASO%: 0.3 % (ref 0.0–2.0)
Basophils Absolute: 0 10*3/uL (ref 0.0–0.1)
EOS%: 2.8 % (ref 0.0–7.0)
Eosinophils Absolute: 0.2 10*3/uL (ref 0.0–0.5)
HCT: 41.2 % (ref 34.8–46.6)
HGB: 13.5 g/dL (ref 11.6–15.9)
LYMPH%: 27.1 % (ref 14.0–49.7)
MCH: 29.4 pg (ref 25.1–34.0)
MCHC: 32.8 g/dL (ref 31.5–36.0)
MCV: 89.8 fL (ref 79.5–101.0)
MONO#: 0.5 10*3/uL (ref 0.1–0.9)
MONO%: 7 % (ref 0.0–14.0)
NEUT#: 4.1 10*3/uL (ref 1.5–6.5)
NEUT%: 62.8 % (ref 38.4–76.8)
Platelets: 144 10*3/uL — ABNORMAL LOW (ref 145–400)
RBC: 4.59 10*6/uL (ref 3.70–5.45)
RDW: 13.6 % (ref 11.2–14.5)
WBC: 6.5 10*3/uL (ref 3.9–10.3)
lymph#: 1.8 10*3/uL (ref 0.9–3.3)

## 2016-07-10 NOTE — Telephone Encounter (Signed)
Gave patient AVS and calender per 3/19 los .[place], per Anderson Malta patient is on the work que and they will contact her for Mammogram.

## 2016-07-12 ENCOUNTER — Encounter: Payer: Self-pay | Admitting: Hematology

## 2016-12-20 ENCOUNTER — Inpatient Hospital Stay: Admission: RE | Admit: 2016-12-20 | Payer: BLUE CROSS/BLUE SHIELD | Source: Ambulatory Visit

## 2017-01-03 ENCOUNTER — Other Ambulatory Visit: Payer: Self-pay | Admitting: Family Medicine

## 2017-01-03 ENCOUNTER — Other Ambulatory Visit (HOSPITAL_COMMUNITY)
Admission: RE | Admit: 2017-01-03 | Discharge: 2017-01-03 | Disposition: A | Discharge status: Home / Self Care | Payer: BLUE CROSS/BLUE SHIELD | Source: Ambulatory Visit | Attending: Family Medicine | Admitting: Family Medicine

## 2017-01-03 DIAGNOSIS — Z124 Encounter for screening for malignant neoplasm of cervix: Secondary | ICD-10-CM | POA: Diagnosis not present

## 2017-01-04 ENCOUNTER — Ambulatory Visit
Admission: RE | Admit: 2017-01-04 | Discharge: 2017-01-04 | Disposition: A | Discharge status: Home / Self Care | Payer: BLUE CROSS/BLUE SHIELD | Source: Ambulatory Visit | Attending: Hematology | Admitting: Hematology

## 2017-01-04 DIAGNOSIS — Z170 Estrogen receptor positive status [ER+]: Secondary | ICD-10-CM

## 2017-01-04 DIAGNOSIS — C50511 Malignant neoplasm of lower-outer quadrant of right female breast: Secondary | ICD-10-CM

## 2017-01-04 LAB — CYTOLOGY - PAP: Diagnosis: NEGATIVE

## 2017-01-04 NOTE — Progress Notes (Deleted)
No show   REVIEW OF SYSTEMS:  *** Review of Systems  Constitutional: Negative.   HENT: Negative.   Eyes: Negative.   Respiratory: Negative.   Cardiovascular: Negative.   Gastrointestinal: Negative.   Genitourinary: Negative.   Musculoskeletal: Negative.   Skin: Negative.   Endo/Heme/Allergies: Negative.   Psychiatric/Behavioral: Negative.   All other systems reviewed and are negative.

## 2017-01-10 ENCOUNTER — Ambulatory Visit: Payer: BLUE CROSS/BLUE SHIELD | Admitting: Hematology

## 2017-01-10 ENCOUNTER — Other Ambulatory Visit: Payer: BLUE CROSS/BLUE SHIELD

## 2017-01-16 ENCOUNTER — Telehealth: Payer: Self-pay | Admitting: Hematology

## 2017-01-16 NOTE — Telephone Encounter (Signed)
Called patient and scheduled appts per 9/19 sch msg

## 2017-01-17 ENCOUNTER — Telehealth: Payer: Self-pay | Admitting: Hematology

## 2017-01-17 NOTE — Telephone Encounter (Signed)
Spoke with patient and confirmed appt.

## 2017-01-19 NOTE — Progress Notes (Signed)
[facility]  OFFICE PROGRESS NOTE  CC  Maury Dus, MD [address] [place] [address] Dr. Alphonsa Overall  Dr. Arloa Koh   DIAGNOSIS: 61 y.o.  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  STAGE:  Cancer of lower-outer quadrant of female breast  Primary site: Breast (Right)  Staging method: AJCC 7th Edition  Clinical: Stage IA (T1c, N0, cM0)  Summary: Stage IA (T1c, N0, cM0)   PRIOR THERAPY: #1On 04/30/2012 patient had a short interval followup of a nodule along the right lower quadrant of the posterior right breast. Interval nodule was seen again along the right lower quadrant. She had an ultrasound done that showed a cyst at the 6:00 position and a 4 x 3 x 4 mm hypoechoic nodule at the 8:00 position 8 cm from the nipple. This was felt to be smaller than a nodule noted in the mammogram and did not correlate to the mammographic findings  #2 Patient had a stereotactic biopsy performed on 06/10/2012. The pathology showed a invasive mammary carcinoma with extravasated mucin. Tumor was low grade, ER positive PR +100% with a proliferation marker Ki-67 of 22%.  #3 s/p right needle loc lumpectomy with right axillary sentinel lymph node biopsy on 08/20/12 with path  1.0 cm IDC with extravasated mucin, 0/3 nodes, ER 100%, PR 100% Her2Neu neg, Ki-67 22%  #3 Oncotype Dx performed on 5/14 with recurrnece score of 13 and distant recurrence rate of 8% , in the low risk category.  #4  Patient underwent adjuvant radiation therapy from 10/01/12 through 10/31/12.    #5 Adjuvant Arimidex starting 12/20/12  CURRENT THERAPY: Arimidex daily started 12/20/12  INTERVAL HISTORY:  [place] 60 y.o. female returns for follow up her breast cancer. I last saw her 6 months ago. She presents to the clinic today reporting no new concerns since last visit. She is tolerating Anastrozole well with no hot flashes or joint pains but will  experience joint stiffness from sitting too long. Walking will resolve this. She does self breast exam occasionally and we went over what to look for during her exams. She has not changed her medications lately. She has not been taking calcium due to her previous hypercalcemia. She takes Xanax as needed only. Overall she is doing well.     MEDICAL HISTORY: Past Medical History:  Diagnosis Date  . Breast cancer ([place])    right 2014  . GERD (gastroesophageal reflux disease)   . Hepatitis C   . Hypertension   . Status post radiation therapy 10/01/12-10/31/12   rt breast/4250cGy 17 sessions/ rt breat boost=750cGy /3 sessions  . TB (pulmonary tuberculosis)     ALLERGIES:  is allergic to procardia [nifedipine].  MEDICATIONS:  Current Outpatient Prescriptions  Medication Sig Dispense Refill  . ALPRAZolam (XANAX) 0.25 MG tablet Take 1 tablet (0.25 mg total) by mouth at bedtime as needed for anxiety. 15 tablet 0  . amLODipine (NORVASC) 10 MG tablet Take 10 mg by mouth daily.    Marland Kitchen anastrozole (ARIMIDEX) 1 MG tablet Take 1 tablet (1 mg total) by mouth daily. 90 tablet 1  . fish oil-omega-3 fatty acids 1000 MG capsule Take 1 g by mouth daily.    Marland Kitchen losartan-hydrochlorothiazide (HYZAAR) 100-25 MG per tablet Take 1 tablet by mouth daily.    . Multiple Vitamin (MULTIVITAMIN WITH MINERALS) TABS Take 1 tablet by mouth daily.     No current facility-administered medications for  this visit.      SURGICAL HISTORY:  Past Surgical History:  Procedure Laterality Date  . BREAST LUMPECTOMY WITH NEEDLE LOCALIZATION AND AXILLARY SENTINEL LYMPH NODE BX Right 08/20/2012   Procedure: BREAST LUMPECTOMY WITH NEEDLE LOCALIZATION AND AXILLARY SENTINEL LYMPH NODE BX;  Surgeon: Shann Medal, MD;  Location: [facility];  Service: General;  Laterality: Right;  . BREAST SURGERY    . MANDIBLE FRACTURE SURGERY    . THYROID CYST EXCISION    . TUBAL LIGATION      REVIEW OF SYSTEMS:   Review of Systems  Constitutional:  Negative.   HENT: Negative.   Eyes: Negative.   Respiratory: Negative.   Cardiovascular: Negative.   Gastrointestinal: Negative.   Genitourinary: Negative.   Musculoskeletal: Negative.   Skin: Negative.   Endo/Heme/Allergies: Negative.   Psychiatric/Behavioral: Negative.   All other systems reviewed and are negative. A 10 point review of systems was conducted and is otherwise negative except for what is noted above.   MSK: (+) Joint stiffness  Health Maintenance  Mammogram:  01/04/17 Colonoscopy: 2011 Bone Density Scan: Osteopenic 01/2016 Pap Smear: 02/2011 Eye Exam: 12/2012 Vitamin D Level: done Lipid Panel: 2013  PHYSICAL EXAMINATION:  Blood pressure (!) 148/88, pulse 84, temperature 99 F (37.2 C), temperature source Oral, resp. rate 20, height 5' 7.75" (1.721 m), weight 155 lb 8 oz (70.5 kg), SpO2 100 %. Body mass index is 23.82 kg/m.   General: Patient is a well appearing female in no acute distress HEENT: PERRLA, sclerae anicteric no conjunctival pallor, MMM Neck: supple, no palpable adenopathy Lungs: clear to auscultation bilaterally, no wheezes, rhonchi, or rales Cardiovascular: regular rate rhythm, S1, S2, no murmurs, rubs or gallops Abdomen: Soft, non-tender, non-distended, normoactive bowel sounds, no HSM Extremities: warm and well perfused, no clubbing, cyanosis, or edema Skin: No rashes or lesions Neuro: Non-focal Breasts: Right lumpectomy site without nodularity, Left breast no nodules or masses. No adenopathy in either axilla and no cervical or supraclavicular adenopathy noted.  ECOG PERFORMANCE STATUS: 0 - Asymptomatic  LABORATORY DATA: CBC Latest Ref Rng & Units 01/23/2017 07/10/2016 01/03/2016  WBC 3.9 - 10.3 10e3/uL 5.6 6.5 7.2  Hemoglobin 11.6 - 15.9 g/dL 13.9 13.5 13.2  Hematocrit 34.8 - 46.6 % 42.1 41.2 40.8  Platelets 145 - 400 10e3/uL 146 144(L) 156    CMP Latest Ref Rng & Units 01/23/2017 07/10/2016 01/03/2016  Glucose 70 - 140 mg/dl 90 88 86   BUN 7.0 - 26.0 mg/dL 13.8 14.4 15.1  Creatinine 0.6 - 1.1 mg/dL 0.8 0.8 1.0  Sodium 136 - 145 mEq/L 142 142 142  Potassium 3.5 - 5.1 mEq/L 3.7 3.9 4.2  Chloride 98 - 107 mEq/L - - -  CO2 22 - 29 mEq/L '25 28 26  ' Calcium 8.4 - 10.4 mg/dL 10.3 10.5(H) 10.5(H)  Total Protein 6.4 - 8.3 g/dL 8.1 7.9 8.1  Total Bilirubin 0.20 - 1.20 mg/dL 0.75 0.65 0.42  Alkaline Phos 40 - 150 U/L 86 100 101  AST 5 - 34 U/L '21 19 18  ' ALT 0 - 55 U/L '20 20 20    ' RADIOGRAPHIC STUDIES:  Diagnostic mammogram 01/04/17 IMPRESSION: Stable post lumpectomy changes of the right breast. RECOMMENDATION: Recommend continued annual bilateral diagnostic mammographic evaluation.  Mammogram 12/13/2015 IMPRESSION: No findings worrisome for recurrent tumor or developing malignancy.  Bone density scan 02/03/2016:  The BMD measured at Femur Neck Right is 0.790 g/cm2 with a T-score of -1.8. This patient is considered osteopenic according to [facility] (  WHO) criteria. L- 3, L-4 were excluded due to degenerative changes. There has been no statistically significant change in BMD of left hip since prior exam dated 12/30/2013. Spine was not compared to prior study due to exclusion of vertebral bodies on current exam. Site Region Measured Date Measured Age YA BMD Significant CHANGE T-score AP Spine L1-L2 02/03/2016 62.6 -1.8 0.961 g/cm2 DualFemur Neck Right 02/03/2016 62.6 -1.8 0.790 g/cm2 World Health Organization [facility]) criteria for post-menopausal, Caucasian Women: Normal T-score at or above -1 SD Osteopenia T-score between -1 and -2.5 SD Osteoporosis T-score at or below -2.5 SD  ASSESSMENT: 61 y.o. female with   1. Stage I (T1N0) IDC ER 100%, PR 100% Her2Neu neg, Ki-67 22%, Oncotype RS 13 -Her Oncotype her Oncotype recurrence score was 13, low risk, predicts 10 year distant recurrence rate of 8% with tamoxifen -She will continue anastrozole, a total 5-10  years adjuvant therapy. She asked about if she needs to take anastrozole for a total of 10 years, giving her early stage and low risk disease, I think it 5 years is probably adequate. We do have recent clinical data that 10 years of aromatase inhibitor decreases disease free survival, however no benefit of overall survival. -She is concerned about the recurrence, after her friend had recurrent breast cancer 6 years out of her initial surgery. She is tolerating anastrozole very well, I think is okay if she wants to continue anastrozole beyond 5 years for up to 7 years if she continues tolerating anastrozole well. -I explained to watch for palpable lumps, nipple discharge, nipple resection, or skin change during her self breast exams.  -She had a flu shot and annual physical with Dr. Mariea Clonts previously. -Lab reviewed, normal CBC and CMP. She is clinically doing well, her physical exam and 2018 mammogram showed no evidence of recurrence. -We'll continue anastrozole. She is tolerating well. She will reach year 5 of Anastrozole next year. I discussed the small benefit of extended anastrozole beyond 5 years and she is interested. I recommend a total of 7 years therapy.  -Next mammogram due in August 2019.  -I encouraged her to continue healthy diet and exercise regularly.  -F/u in 6 months then once a year after.  2. Osteopenia -Her last bone density scan in September 2015 showed osteopenia. -We again discussed that anastrozole can weak of bone, increase her risk of fracture -Her recent vitamin D level was mildly low, I encouraged her to increase vitamin D supplement to 2000u daily, and exercise regularly, she is compliant, does weight. Exercise. Due to her mild hypercalcemia, she has been off calcium supplement. -Last bone density scan was October 2017 with Femur Neck Right T-score of -1.8, no high risk for fracture.  Next due 01/2018  3. Hypercalcemia -Her PTH was normal. I encouraged her to drink more  water, we'll follow-up her calcium level.  -Calcium normal at 10.3 today (01/23/17) -I again encouraged her to drink more water to help maintain normal calcium levels.  4. HTN, Hep C -She'll continue follow-up with her primary care physician  PLAN:  -Refilled and she will Continue Anastrozole  -RTC in 6 month with lab, including CBC, CMP. Plan to see her on annual bases after next visit    I spent 15 minutes counseling the patient face to face. The total time spent in the appointment was 20 minutes.  This document serves as a record of services personally performed by Truitt Merle, MD. It was created on her behalf by Joslyn Devon, a  trained medical scribe. The creation of this record is based on the scribe's personal observations and the provider's statements to them. This document has been checked and approved by the attending provider.   I have reviewed the above documentation for accuracy and completeness and I agree with the above.  Truitt Merle 01/23/2017

## 2017-01-23 ENCOUNTER — Encounter: Payer: Self-pay | Admitting: Hematology

## 2017-01-23 ENCOUNTER — Telehealth: Payer: Self-pay | Admitting: Hematology

## 2017-01-23 ENCOUNTER — Other Ambulatory Visit (HOSPITAL_BASED_OUTPATIENT_CLINIC_OR_DEPARTMENT_OTHER): Payer: BLUE CROSS/BLUE SHIELD

## 2017-01-23 ENCOUNTER — Ambulatory Visit (HOSPITAL_BASED_OUTPATIENT_CLINIC_OR_DEPARTMENT_OTHER): Payer: BLUE CROSS/BLUE SHIELD | Admitting: Hematology

## 2017-01-23 VITALS — BP 148/88 | HR 84 | Temp 99.0°F | Resp 20 | Ht 67.75 in | Wt 155.5 lb

## 2017-01-23 DIAGNOSIS — Z170 Estrogen receptor positive status [ER+]: Secondary | ICD-10-CM

## 2017-01-23 DIAGNOSIS — I10 Essential (primary) hypertension: Secondary | ICD-10-CM | POA: Diagnosis not present

## 2017-01-23 DIAGNOSIS — E559 Vitamin D deficiency, unspecified: Secondary | ICD-10-CM

## 2017-01-23 DIAGNOSIS — M8580 Other specified disorders of bone density and structure, unspecified site: Secondary | ICD-10-CM

## 2017-01-23 DIAGNOSIS — B1920 Unspecified viral hepatitis C without hepatic coma: Secondary | ICD-10-CM

## 2017-01-23 DIAGNOSIS — C50511 Malignant neoplasm of lower-outer quadrant of right female breast: Secondary | ICD-10-CM

## 2017-01-23 LAB — COMPREHENSIVE METABOLIC PANEL
ALT: 20 U/L (ref 0–55)
AST: 21 U/L (ref 5–34)
Albumin: 4.4 g/dL (ref 3.5–5.0)
Alkaline Phosphatase: 86 U/L (ref 40–150)
Anion Gap: 10 mEq/L (ref 3–11)
BUN: 13.8 mg/dL (ref 7.0–26.0)
CO2: 25 mEq/L (ref 22–29)
Calcium: 10.3 mg/dL (ref 8.4–10.4)
Chloride: 106 mEq/L (ref 98–109)
Creatinine: 0.8 mg/dL (ref 0.6–1.1)
EGFR: >90 mL/min/{1.73_m2} (ref >90)
Glucose: 90 mg/dl (ref 70–140)
Potassium: 3.7 mEq/L (ref 3.5–5.1)
Sodium: 142 mEq/L (ref 136–145)
Total Bilirubin: 0.75 mg/dL (ref 0.20–1.20)
Total Protein: 8.1 g/dL (ref 6.4–8.3)

## 2017-01-23 LAB — CBC WITH DIFFERENTIAL/PLATELET
BASO%: 0.8 % (ref 0.0–2.0)
Basophils Absolute: 0 10*3/uL (ref 0.0–0.1)
EOS%: 2.2 % (ref 0.0–7.0)
Eosinophils Absolute: 0.1 10*3/uL (ref 0.0–0.5)
HCT: 42.1 % (ref 34.8–46.6)
HGB: 13.9 g/dL (ref 11.6–15.9)
LYMPH%: 21.3 % (ref 14.0–49.7)
MCH: 29.1 pg (ref 25.1–34.0)
MCHC: 33.1 g/dL (ref 31.5–36.0)
MCV: 87.8 fL (ref 79.5–101.0)
MONO#: 0.4 10*3/uL (ref 0.1–0.9)
MONO%: 7.5 % (ref 0.0–14.0)
NEUT#: 3.8 10*3/uL (ref 1.5–6.5)
NEUT%: 68.2 % (ref 38.4–76.8)
Platelets: 146 10*3/uL (ref 145–400)
RBC: 4.8 10*6/uL (ref 3.70–5.45)
RDW: 14.1 % (ref 11.2–14.5)
WBC: 5.6 10*3/uL (ref 3.9–10.3)
lymph#: 1.2 10*3/uL (ref 0.9–3.3)

## 2017-01-23 MED ORDER — ANASTROZOLE 1 MG PO TABS: 1.0000 mg | ORAL_TABLET | Freq: Every day | ORAL | 1 refills | Status: DC

## 2017-01-23 NOTE — Telephone Encounter (Signed)
Gave avs and calendar for April 2019 °

## 2017-01-24 LAB — VITAMIN D 25 HYDROXY (VIT D DEFICIENCY, FRACTURES): Vitamin D, 25-Hydroxy: 28.9 ng/mL — ABNORMAL LOW (ref 30.0–100.0)

## 2017-01-30 ENCOUNTER — Telehealth: Payer: Self-pay | Admitting: *Deleted

## 2017-01-30 NOTE — Telephone Encounter (Signed)
Pt was reached on mobile phone and informed of VitD level being still slightly low, but better that it was last year.  Per Dr Ernestina Penna message, Pt is to increase VitD by 1000U daily.  These may be purchased OTC.  Pt voiced understanding and had no questions.  She was reminded of her next visit in April 2019.

## 2017-07-17 ENCOUNTER — Other Ambulatory Visit: Payer: Self-pay | Admitting: *Deleted

## 2017-07-17 DIAGNOSIS — Z170 Estrogen receptor positive status [ER+]: Secondary | ICD-10-CM

## 2017-07-17 DIAGNOSIS — C50511 Malignant neoplasm of lower-outer quadrant of right female breast: Secondary | ICD-10-CM

## 2017-07-17 MED ORDER — ANASTROZOLE 1 MG PO TABS: 1.0000 mg | ORAL_TABLET | Freq: Every day | ORAL | 1 refills | Status: DC

## 2017-07-23 NOTE — Progress Notes (Signed)
[facility]  OFFICE PROGRESS NOTE  CC  Maury Dus, MD [address] [place] [address] Dr. Alphonsa Overall  Dr. Arloa Koh   DIAGNOSIS: 62 y.o.  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  STAGE:  Cancer of lower-outer quadrant of female breast  Primary site: Breast (Right)  Staging method: AJCC 7th Edition  Clinical: Stage IA (T1c, N0, cM0)  Summary: Stage IA (T1c, N0, cM0)   PRIOR THERAPY: #1On 04/30/2012 patient had a short interval followup of a nodule along the right lower quadrant of the posterior right breast. Interval nodule was seen again along the right lower quadrant. She had an ultrasound done that showed a cyst at the 6:00 position and a 4 x 3 x 4 mm hypoechoic nodule at the 8:00 position 8 cm from the nipple. This was felt to be smaller than a nodule noted in the mammogram and did not correlate to the mammographic findings  #2 Patient had a stereotactic biopsy performed on 06/10/2012. The pathology showed a invasive mammary carcinoma with extravasated mucin. Tumor was low grade, ER positive PR +100% with a proliferation marker Ki-67 of 22%.  #3 s/p right needle loc lumpectomy with right axillary sentinel lymph node biopsy on 08/20/12 with path  1.0 cm IDC with extravasated mucin, 0/3 nodes, ER 100%, PR 100% Her2Neu neg, Ki-67 22%  #3 Oncotype Dx performed on 5/14 with recurrnece score of 13 and distant recurrence rate of 8% , in the low risk category.  #4  Patient underwent adjuvant radiation therapy from 10/01/12 through 10/31/12.    #5 Adjuvant Arimidex starting 12/20/12  CURRENT THERAPY: Arimidex daily started 12/20/12  INTERVAL HISTORY:  Sharon Nguyen 62 y.o. female returns for follow up her breast cancer. I last saw her 6 months ago. She presents to the clinic today by herself. She reports she is doing well overall. She states she joined a study called [employer] at [employer]. It is  an exercise program. She started 8 weeks ago. She states she has has not been as stiff in her joints since starting this program.   She is compliant with Anastrozole and reports no complaints at this time.   On review of systems, pt denies new pain, or any other complaints at this time. Pertinent positives are listed and detailed within the above HPI.   MEDICAL HISTORY: Past Medical History:  Diagnosis Date  . Breast cancer ([place])    right 2014  . GERD (gastroesophageal reflux disease)   . Hepatitis C   . Hypertension   . Status post radiation therapy 10/01/12-10/31/12   rt breast/4250cGy 17 sessions/ rt breat boost=750cGy /3 sessions  . TB (pulmonary tuberculosis)     ALLERGIES:  is allergic to procardia [nifedipine].  MEDICATIONS:  Current Outpatient Medications  Medication Sig Dispense Refill  . ALPRAZolam (XANAX) 0.25 MG tablet Take 1 tablet (0.25 mg total) by mouth at bedtime as needed for anxiety. 15 tablet 0  . amLODipine (NORVASC) 10 MG tablet Take 10 mg by mouth daily.    Marland Kitchen anastrozole (ARIMIDEX) 1 MG tablet Take 1 tablet (1 mg total) by mouth daily. 90 tablet 1  . fish oil-omega-3 fatty acids 1000 MG capsule Take 1 g by mouth daily.    Marland Kitchen losartan-hydrochlorothiazide (HYZAAR) 100-25 MG per tablet Take 1 tablet by mouth daily.    . Multiple Vitamin (MULTIVITAMIN WITH MINERALS) TABS Take 1 tablet by mouth daily.  No current facility-administered medications for this visit.      SURGICAL HISTORY:  Past Surgical History:  Procedure Laterality Date  . BREAST LUMPECTOMY WITH NEEDLE LOCALIZATION AND AXILLARY SENTINEL LYMPH NODE BX Right 08/20/2012   Procedure: BREAST LUMPECTOMY WITH NEEDLE LOCALIZATION AND AXILLARY SENTINEL LYMPH NODE BX;  Surgeon: Shann Medal, MD;  Location: [facility];  Service: General;  Laterality: Right;  . BREAST SURGERY    . MANDIBLE FRACTURE SURGERY    . THYROID CYST EXCISION    . TUBAL LIGATION       REVIEW OF SYSTEMS:   Constitutional: Denies  fevers, chills or abnormal night sweats (+) good energy Eyes: Denies blurriness of vision, double vision or watery eyes Ears, nose, mouth, throat, and face: Denies mucositis or sore throat Respiratory: Denies dyspnea or wheezes  Cardiovascular: Denies palpitation, chest discomfort or lower extremity swelling Gastrointestinal:  Denies nausea or change in bowel habits Skin: Denies abnormal skin rashes Lymphatics: Denies new lymphadenopathy or easy bruising Neurological:Denies numbness, tingling or new weaknesses Behavioral/Psych: Mood is stable, no new changes  All other systems were reviewed with the patient and are negative.  Health Maintenance  Mammogram:  01/04/17 Colonoscopy: 2011 Bone Density Scan: Osteopenic 01/2016 Pap Smear: 02/2011 Eye Exam: 12/2012 Vitamin D Level: done Lipid Panel: 2013  PHYSICAL EXAMINATION:  Blood pressure 136/84, pulse 74, temperature 98 F (36.7 C), temperature source Oral, resp. rate 18, height 5' 7.75" (1.721 m), weight 160 lb 12.8 oz (72.9 kg), SpO2 100 %. Body mass index is 24.63 kg/m.   General: Patient is a well appearing female in no acute distress HEENT: PERRLA, sclerae anicteric no conjunctival pallor, MMM Neck: supple, no palpable adenopathy Lungs: clear to auscultation bilaterally, no wheezes, rhonchi, or rales Cardiovascular: regular rate rhythm, S1, S2, no murmurs, rubs or gallops Abdomen: Soft, non-tender, non-distended, normoactive bowel sounds, no HSM Extremities: warm and well perfused, no clubbing, cyanosis, or edema Skin: No rashes or lesions Neuro: Non-focal Breasts: Right lumpectomy site without nodularity, Left breast no nodules or masses. No adenopathy in either axilla and no cervical or supraclavicular adenopathy noted.  ECOG PERFORMANCE STATUS: 0 - Asymptomatic  LABORATORY DATA: CBC Latest Ref Rng & Units 07/25/2017 01/23/2017 07/10/2016  WBC 3.9 - 10.3 K/uL 7.3 5.6 6.5  Hemoglobin 11.6 - 15.9 g/dL 14.6 13.9 13.5   Hematocrit 34.8 - 46.6 % 45.2 42.1 41.2  Platelets 145 - 400 K/uL 158 146 144(L)    CMP Latest Ref Rng & Units 07/25/2017 01/23/2017 07/10/2016  Glucose 70 - 140 mg/dL 87 90 88  BUN 7 - 26 mg/dL 20 13.8 14.4  Creatinine 0.60 - 1.10 mg/dL 0.86 0.8 0.8  Sodium 136 - 145 mmol/L 140 142 142  Potassium 3.5 - 5.1 mmol/L 3.8 3.7 3.9  Chloride 98 - 109 mmol/L 102 - -  CO2 22 - 29 mmol/L _0 Calcium 8.4 - 10.4 mg/dL 10.9(H) 10.3 10.5(H)  Total Protein 6.4 - 8.3 g/dL 8.5(H) 8.1 7.9  Total Bilirubin 0.2 - 1.2 mg/dL 0.5 0.75 0.65  Alkaline Phos 40 - 150 U/L 100 86 100  AST 5 - 34 U/L _1 ALT 0 - 55 U/L _2 RADIOGRAPHIC STUDIES:  Diagnostic mammogram 01/04/17 IMPRESSION: Stable post lumpectomy changes of the right breast. RECOMMENDATION: Recommend continued annual bilateral diagnostic mammographic evaluation.  Mammogram 12/13/2015 IMPRESSION: No findings worrisome for recurrent tumor or developing malignancy.  Bone density scan 02/03/2016:  The BMD measured at Femur Neck  Right is 0.790 g/cm2 with a T-score of -1.8. This patient is considered osteopenic according to [facility]) criteria. L- 3, L-4 were excluded due to degenerative changes. There has been no statistically significant change in BMD of left hip since prior exam dated 12/30/2013. Spine was not compared to prior study due to exclusion of vertebral bodies on current exam. Site Region Measured Date Measured Age YA BMD Significant CHANGE T-score AP Spine L1-L2 02/03/2016 62.6 -1.8 0.961 g/cm2 DualFemur Neck Right 02/03/2016 62.6 -1.8 0.790 g/cm2 World Health Organization [facility]) criteria for post-menopausal, Caucasian Women: Normal T-score at or above -1 SD Osteopenia T-score between -1 and -2.5 SD Osteoporosis T-score at or below -2.5 SD  ASSESSMENT: 62 y.o. female with   1. Stage I (T1N0) IDC ER 100%, PR 100% Her2Neu neg, Ki-67 22%,  Oncotype RS 13 -Her Oncotype her Oncotype recurrence score was 13, low risk, predicts 10 year distant recurrence rate of 8% with tamoxifen -She will continue anastrozole, a total 5-10 years adjuvant therapy. She asked previously if she needed to take anastrozole for a total of 10 years, giving her early stage and low risk disease, I think it 5 years is probably adequate. We do have recent clinical data that 10 years of aromatase inhibitor decreases disease free survival, however no benefit of overall survival. -She was concerned about the recurrence, after her friend had recurrent breast cancer 6 years out of her initial surgery. She is tolerating anastrozole very well, I think is okay if she wants to continue anastrozole beyond 5 years for up to 7 years if she continues tolerating anastrozole well. She agrees.  -Lab reviewed, normal CBC and her CMP reveals an increased Calcium level.. She is clinically doing well, her physical exam and 2018 mammogram showed no evidence of recurrence. -We'll continue anastrozole. She is tolerating well. She will reach year 5 of Anastrozole in Aug 2019. I again discussed the small benefit of extended anastrozole beyond 5 years and she is interested. I recommend a total of 7 years therapy.  -Next mammogram due in Sept 2019. She can go back to screening mammogram since it's been 5 years  -I encouraged her to continue healthy diet and exercise regularly.  -F/u in 6 months, then annual visit after next visit   2. Osteopenia -Her last bone density scan in September 2015 showed osteopenia. -We previously discussed that anastrozole can cause weakening of bone and increase her risk of fracture -Her vitamin D level was mildly low previously and I encouraged her to increase vitamin D supplement to 2000u daily, and exercise regularly, she is compliant, does exercise with weights. - Due to her mild hypercalcemia, she has been off calcium supplement. -Last bone density scan was  October 2017 with Femur Neck Right T-score of -1.8, no high risk for fracture.  Next due 01/2018  3. Hypercalcemia -Her PTH was normal in 2017. I encouraged her to drink more water, we'll follow-up her calcium level.  -Calcium normal at 10.3 previously (01/23/17) I encouraged her to drink more water to help maintain normal calcium levels. -Her Ca is 10.9 today (07/23/17) I advised her to avoid calcium products in her diet. Will monitor   4. HTN, Hep C -She'll continue follow-up with her primary care physician  PLAN:  -Continue Anastrozole  -Avoid Ca supplement or calcium rich food -DEXA and Screening Mammogram in 12/2017 -Lab and f/u in 6 months   All questions were answered. The patient knows to call the clinic with any  problems, questions or concerns. I spent 20 minutes counseling the patient face to face. The total time spent in the appointment was 25 minutes.  This document serves as a record of services personally performed by Truitt Merle, MD. It was created on her behalf by Theresia Bough, a trained medical scribe. The creation of this record is based on the scribe's personal observations and the provider's statements to them.   I have reviewed the above documentation for accuracy and completeness, and I agree with the above.   Truitt Merle 07/25/2017 10:37 AM

## 2017-07-25 ENCOUNTER — Inpatient Hospital Stay: Payer: BLUE CROSS/BLUE SHIELD

## 2017-07-25 ENCOUNTER — Inpatient Hospital Stay: Payer: BLUE CROSS/BLUE SHIELD | Attending: Hematology | Admitting: Hematology

## 2017-07-25 ENCOUNTER — Encounter: Payer: Self-pay | Admitting: Hematology

## 2017-07-25 ENCOUNTER — Telehealth: Payer: Self-pay | Admitting: Hematology

## 2017-07-25 VITALS — BP 136/84 | HR 74 | Temp 98.0°F | Resp 18 | Ht 67.75 in | Wt 160.8 lb

## 2017-07-25 DIAGNOSIS — C50511 Malignant neoplasm of lower-outer quadrant of right female breast: Secondary | ICD-10-CM | POA: Diagnosis not present

## 2017-07-25 DIAGNOSIS — M8580 Other specified disorders of bone density and structure, unspecified site: Secondary | ICD-10-CM | POA: Diagnosis not present

## 2017-07-25 DIAGNOSIS — Z170 Estrogen receptor positive status [ER+]: Secondary | ICD-10-CM

## 2017-07-25 DIAGNOSIS — B1920 Unspecified viral hepatitis C without hepatic coma: Secondary | ICD-10-CM | POA: Diagnosis not present

## 2017-07-25 DIAGNOSIS — E2839 Other primary ovarian failure: Secondary | ICD-10-CM

## 2017-07-25 DIAGNOSIS — I10 Essential (primary) hypertension: Secondary | ICD-10-CM | POA: Diagnosis not present

## 2017-07-25 LAB — COMPREHENSIVE METABOLIC PANEL
ALT: 26 U/L (ref 0–55)
AST: 20 U/L (ref 5–34)
Albumin: 4.7 g/dL (ref 3.5–5.0)
Alkaline Phosphatase: 100 U/L (ref 40–150)
Anion gap: 10 (ref 5–15)
BUN: 20 mg/dL (ref 7–26)
CO2: 28 mmol/L (ref 22–29)
Calcium: 10.9 mg/dL — ABNORMAL HIGH (ref 8.4–10.4)
Chloride: 102 mmol/L (ref 98–109)
Creatinine, Ser: 0.86 mg/dL (ref 0.60–1.10)
GFR calc Af Amer: >60 mL/min (ref >60)
GFR calc non Af Amer: >60 mL/min (ref >60)
Glucose, Bld: 87 mg/dL (ref 70–140)
Potassium: 3.8 mmol/L (ref 3.5–5.1)
Sodium: 140 mmol/L (ref 136–145)
Total Bilirubin: 0.5 mg/dL (ref 0.2–1.2)
Total Protein: 8.5 g/dL — ABNORMAL HIGH (ref 6.4–8.3)

## 2017-07-25 LAB — CBC WITH DIFFERENTIAL/PLATELET
Basophils Absolute: 0 10*3/uL (ref 0.0–0.1)
Basophils Relative: 0 %
Eosinophils Absolute: 0.2 10*3/uL (ref 0.0–0.5)
Eosinophils Relative: 3 %
HCT: 45.2 % (ref 34.8–46.6)
Hemoglobin: 14.6 g/dL (ref 11.6–15.9)
Lymphocytes Relative: 22 %
Lymphs Abs: 1.6 10*3/uL (ref 0.9–3.3)
MCH: 29.2 pg (ref 25.1–34.0)
MCHC: 32.3 g/dL (ref 31.5–36.0)
MCV: 90.4 fL (ref 79.5–101.0)
Monocytes Absolute: 0.4 10*3/uL (ref 0.1–0.9)
Monocytes Relative: 6 %
Neutro Abs: 5.1 10*3/uL (ref 1.5–6.5)
Neutrophils Relative %: 69 %
Platelets: 158 10*3/uL (ref 145–400)
RBC: 5 MIL/uL (ref 3.70–5.45)
RDW: 14.2 % (ref 11.2–14.5)
WBC: 7.3 10*3/uL (ref 3.9–10.3)

## 2017-07-25 NOTE — Telephone Encounter (Signed)
Scheduled appt per 4/3 los - Gave patient AVS and calender per los. Patient be called by GI - breast center for Mammo and Dexa

## 2017-09-10 ENCOUNTER — Ambulatory Visit: Payer: BLUE CROSS/BLUE SHIELD | Admitting: Podiatry

## 2017-09-24 ENCOUNTER — Ambulatory Visit (INDEPENDENT_AMBULATORY_CARE_PROVIDER_SITE_OTHER): Payer: BLUE CROSS/BLUE SHIELD | Admitting: Podiatry

## 2017-09-24 ENCOUNTER — Ambulatory Visit (INDEPENDENT_AMBULATORY_CARE_PROVIDER_SITE_OTHER): Payer: BLUE CROSS/BLUE SHIELD

## 2017-09-24 DIAGNOSIS — M19079 Primary osteoarthritis, unspecified ankle and foot: Secondary | ICD-10-CM | POA: Diagnosis not present

## 2017-09-24 DIAGNOSIS — M2041 Other hammer toe(s) (acquired), right foot: Secondary | ICD-10-CM

## 2017-09-26 NOTE — Progress Notes (Signed)
Subjective:   Patient ID: Sharon Nguyen, female   DOB: 62 y.o.   MRN: [id number]   HPI 62 year old female presents the office with concerns of pain to the right second third toe she occasionally get some sharp pain into the toes.  She points on the dorsal DIPJ where she gets the majority of symptoms and more pain in the third toe.  Denies any recent injury or trauma.  She has not had any pain in the toenail itself he denies any redness or drainage or any swelling.  She said no recent treatment.  No other concerns.   Review of Systems  All other systems reviewed and are negative.  Past Medical History:  Diagnosis Date  . Breast cancer ([place])    right 2014  . GERD (gastroesophageal reflux disease)   . Hepatitis C   . Hypertension   . Status post radiation therapy 10/01/12-10/31/12   rt breast/4250cGy 17 sessions/ rt breat boost=750cGy /3 sessions  . TB (pulmonary tuberculosis)     Past Surgical History:  Procedure Laterality Date  . BREAST LUMPECTOMY WITH NEEDLE LOCALIZATION AND AXILLARY SENTINEL LYMPH NODE BX Right 08/20/2012   Procedure: BREAST LUMPECTOMY WITH NEEDLE LOCALIZATION AND AXILLARY SENTINEL LYMPH NODE BX;  Surgeon: Shann Medal, MD;  Location: [facility];  Service: General;  Laterality: Right;  . BREAST SURGERY    . MANDIBLE FRACTURE SURGERY    . THYROID CYST EXCISION    . TUBAL LIGATION       Current Outpatient Medications:  .  ALPRAZolam (XANAX) 0.25 MG tablet, Take 1 tablet (0.25 mg total) by mouth at bedtime as needed for anxiety., Disp: 15 tablet, Rfl: 0 .  amLODipine (NORVASC) 10 MG tablet, Take 10 mg by mouth daily., Disp: , Rfl:  .  anastrozole (ARIMIDEX) 1 MG tablet, Take 1 tablet (1 mg total) by mouth daily., Disp: 90 tablet, Rfl: 1 .  fish oil-omega-3 fatty acids 1000 MG capsule, Take 1 g by mouth daily., Disp: , Rfl:  .  losartan-hydrochlorothiazide (HYZAAR) 100-25 MG per tablet, Take 1 tablet by mouth daily., Disp: , Rfl:  .  Multiple Vitamin (MULTIVITAMIN WITH  MINERALS) TABS, Take 1 tablet by mouth daily., Disp: , Rfl:   Allergies  Allergen Reactions  . Procardia [Nifedipine] Other (See Comments)    Headaches, body shaking    Social History   Socioeconomic History  . Marital status: Widowed    Spouse name: Not on file  . Number of children: 4  . Years of education: Not on file  . Highest education level: Not on file  Occupational History  . Occupation: Programmer, systems: [employer]    Comment: Producer, television/film/video at [employer]  Social Needs  . Financial resource strain: Not on file  . Food insecurity:    Worry: Not on file    Inability: Not on file  . Transportation needs:    Medical: Not on file    Non-medical: Not on file  Tobacco Use  . Smoking status: Former Research scientist (life sciences)  . Smokeless tobacco: Never Used  Substance and Sexual Activity  . Alcohol use: No  . Drug use: No  . Sexual activity: Not Currently  Lifestyle  . Physical activity:    Days per week: Not on file    Minutes per session: Not on file  . Stress: Not on file  Relationships  . Social connections:    Talks on phone: Not on file  Gets together: Not on file    Attends religious service: Not on file    Active member of club or organization: Not on file    Attends meetings of clubs or organizations: Not on file    Relationship status: Not on file  . Intimate partner violence:    Fear of current or ex partner: Not on file    Emotionally abused: Not on file    Physically abused: Not on file    Forced sexual activity: Not on file  Other Topics Concern  . Not on file  Social History Narrative   Recently widowed April 2014 from bile duct cancer.   Has 4 sons 2 in [place], 1 in [place] and 1 in [place]        Objective:  Physical Exam  General: AAO x3, NAD  Dermatological: There is no pain to the nails and there is no redness or drainage.  Skin is warm, dry and supple bilateral. Nails x 10 are well manicured; remaining integument  appears unremarkable at this time. There are no open sores, no preulcerative lesions, no rash or signs of infection present.  Vascular: Dorsalis Pedis artery and Posterior Tibial artery pedal pulses are 2/4 bilateral with immedate capillary fill time.There is no pain with calf compression, swelling, warmth, erythema.   Neruologic: Grossly intact via light touch bilateral. Protective threshold with Semmes Wienstein monofilament intact to all pedal sites bilateral.   Musculoskeletal:Mallet toe contractures present in the right third toe and there is tenderness of the DIPJ there is mild swelling to the area as well.  Minimal tenderness palpation of this area.  Subjectively she gets some sharp pain.  No other areas of tenderness.  Mild discomfort submetatarsal 1 through 5 on the right sixth and there is a metatarsal heads plantarly.  Muscular strength 5/5 in all groups tested bilateral.  Gait: Unassisted, Nonantalgic.     Assessment:   62 year old female with digital deformity, arthritis of the toes     Plan:  -Treatment options discussed including all alternatives, risks, and complications -Etiology of symptoms were discussed -X-rays were obtained and reviewed with the patient.  Arthritic changes are present in the toes there is a sclerotic line on the DIPJ of the right third toe this is corresponding to area of discomfort.  We discussed offloading to help take pressure off the area.  Metatarsal pads were also dispensed as well as a buttress pads.  Discussed anti-inflammatories as needed.  Also discussed the change in shoes.  Trula Slade DPM

## 2018-01-07 ENCOUNTER — Ambulatory Visit
Admission: RE | Admit: 2018-01-07 | Discharge: 2018-01-07 | Disposition: A | Discharge status: Home / Self Care | Payer: BLUE CROSS/BLUE SHIELD | Source: Ambulatory Visit | Attending: Hematology | Admitting: Hematology

## 2018-01-07 ENCOUNTER — Other Ambulatory Visit: Payer: Self-pay | Admitting: Hematology

## 2018-01-07 DIAGNOSIS — Z170 Estrogen receptor positive status [ER+]: Secondary | ICD-10-CM

## 2018-01-07 DIAGNOSIS — C50511 Malignant neoplasm of lower-outer quadrant of right female breast: Secondary | ICD-10-CM

## 2018-01-07 DIAGNOSIS — E2839 Other primary ovarian failure: Secondary | ICD-10-CM

## 2018-01-08 ENCOUNTER — Other Ambulatory Visit: Payer: Self-pay | Admitting: Oncology

## 2018-01-08 DIAGNOSIS — C50511 Malignant neoplasm of lower-outer quadrant of right female breast: Secondary | ICD-10-CM

## 2018-01-08 DIAGNOSIS — Z170 Estrogen receptor positive status [ER+]: Secondary | ICD-10-CM

## 2018-01-09 ENCOUNTER — Ambulatory Visit
Admission: RE | Admit: 2018-01-09 | Discharge: 2018-01-09 | Disposition: A | Discharge status: Home / Self Care | Payer: BLUE CROSS/BLUE SHIELD | Source: Ambulatory Visit | Attending: Nurse Practitioner | Admitting: Nurse Practitioner

## 2018-01-09 ENCOUNTER — Other Ambulatory Visit: Payer: Self-pay | Admitting: Oncology

## 2018-01-09 ENCOUNTER — Ambulatory Visit
Admission: RE | Admit: 2018-01-09 | Discharge: 2018-01-09 | Disposition: A | Discharge status: Home / Self Care | Payer: BLUE CROSS/BLUE SHIELD | Source: Ambulatory Visit | Attending: Hematology | Admitting: Hematology

## 2018-01-09 ENCOUNTER — Other Ambulatory Visit: Payer: Self-pay | Admitting: *Deleted

## 2018-01-09 ENCOUNTER — Other Ambulatory Visit: Payer: Self-pay | Admitting: Nurse Practitioner

## 2018-01-09 DIAGNOSIS — C50511 Malignant neoplasm of lower-outer quadrant of right female breast: Secondary | ICD-10-CM

## 2018-01-09 DIAGNOSIS — Z170 Estrogen receptor positive status [ER+]: Secondary | ICD-10-CM

## 2018-01-09 DIAGNOSIS — N6310 Unspecified lump in the right breast, unspecified quadrant: Secondary | ICD-10-CM

## 2018-01-22 NOTE — Progress Notes (Signed)
[facility]  OFFICE PROGRESS NOTE  Date of Service:  01/25/2018   CC  Maury Dus, MD [address] [place] [address] Dr. Alphonsa Overall  Dr. Arloa Koh   DIAGNOSIS: 62 y.o.  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  STAGE:  Cancer of lower-outer quadrant of female breast  Primary site: Breast (Right)  Staging method: AJCC 7th Edition  Clinical: Stage IA (T1c, N0, cM0)  Summary: Stage IA (T1c, N0, cM0)   PRIOR THERAPY: #1On 04/30/2012 patient had a short interval followup of a nodule along the right lower quadrant of the posterior right breast. Interval nodule was seen again along the right lower quadrant. She had an ultrasound done that showed a cyst at the 6:00 position and a 4 x 3 x 4 mm hypoechoic nodule at the 8:00 position 8 cm from the nipple. This was felt to be smaller than a nodule noted in the mammogram and did not correlate to the mammographic findings  #2 Patient had a stereotactic biopsy performed on 06/10/2012. The pathology showed a invasive mammary carcinoma with extravasated mucin. Tumor was low grade, ER positive PR +100% with a proliferation marker Ki-67 of 22%.  #3 s/p right needle loc lumpectomy with right axillary sentinel lymph node biopsy on 08/20/12 with path  1.0 cm IDC with extravasated mucin, 0/3 nodes, ER 100%, PR 100% Her2Neu neg, Ki-67 22%  #3 Oncotype Dx performed on 5/14 with recurrence score of 13 and distant recurrence rate of 8% , in the low risk category.  #4  Patient underwent adjuvant radiation therapy from 10/01/12 through 10/31/12.    #5 Adjuvant Arimidex starting 12/20/12   CURRENT THERAPY: Arimidex daily started 12/20/12   INTERVAL HISTORY:  Sharon Nguyen 62 y.o. female returns for follow up her breast cancer. I last saw her 6 months ago. She presents to the clinic today by herself. She notes she is doing well. She notes a cyst was seen on her previous  mammogram in 12/2017. Once aspirated it was found to be a benign cyst.  She has been on Anastrozole for 5 years with no issues. She is set to have DEXA in 02/2018.  She notes she fell after stepping on a toy and injured her right hip recently. She has a bruise on that hip but it is healing. She will see her PCP every February.      MEDICAL HISTORY: Past Medical History:  Diagnosis Date  . Breast cancer ([facility])    right 2014  . GERD (gastroesophageal reflux disease)   . Hepatitis C   . Hypertension   . Status post radiation therapy 10/01/12-10/31/12   rt breast/4250cGy 17 sessions/ rt breat boost=750cGy /3 sessions  . TB (pulmonary tuberculosis)     ALLERGIES:  is allergic to procardia [nifedipine].  MEDICATIONS:  Current Outpatient Medications  Medication Sig Dispense Refill  . ALPRAZolam (XANAX) 0.25 MG tablet Take 1 tablet (0.25 mg total) by mouth at bedtime as needed for anxiety. 15 tablet 0  . amLODipine (NORVASC) 10 MG tablet Take 10 mg by mouth daily.    Marland Kitchen anastrozole (ARIMIDEX) 1 MG tablet Take 1 tablet (1 mg total) by mouth daily. 90 tablet 3  . fish oil-omega-3 fatty acids 1000 MG capsule Take 1 g by mouth daily.    Marland Kitchen losartan-hydrochlorothiazide (HYZAAR) 100-25 MG per tablet Take 1 tablet by mouth daily.    . Multiple Vitamin (MULTIVITAMIN WITH MINERALS)  TABS Take 1 tablet by mouth daily.     No current facility-administered medications for this visit.      SURGICAL HISTORY:  Past Surgical History:  Procedure Laterality Date  . BREAST LUMPECTOMY Right 2014  . BREAST LUMPECTOMY WITH NEEDLE LOCALIZATION AND AXILLARY SENTINEL LYMPH NODE BX Right 08/20/2012   Procedure: BREAST LUMPECTOMY WITH NEEDLE LOCALIZATION AND AXILLARY SENTINEL LYMPH NODE BX;  Surgeon: Shann Medal, MD;  Location: [facility];  Service: General;  Laterality: Right;  . BREAST SURGERY    . MANDIBLE FRACTURE SURGERY    . THYROID CYST EXCISION    . TUBAL LIGATION       REVIEW OF SYSTEMS:    Constitutional: Denies fevers, chills or abnormal night sweats  Eyes: Denies blurriness of vision, double vision or watery eyes Ears, nose, mouth, throat, and face: Denies mucositis or sore throat Respiratory: Denies dyspnea or wheezes  Cardiovascular: Denies palpitation, chest discomfort or lower extremity swelling Gastrointestinal:  Denies nausea or change in bowel habits Skin: Denies abnormal skin rashes (+) bruise on right hip after fall  Lymphatics: Denies new lymphadenopathy or easy bruising Neurological:Denies numbness, tingling or new weaknesses Behavioral/Psych: Mood is stable, no new changes  All other systems were reviewed with the patient and are negative.  Health Maintenance  Mammogram:  01/09/18 Colonoscopy: 2011 Bone Density Scan: Osteopenic 01/2016 Pap Smear: 02/2011 Eye Exam: 12/2012 Vitamin D Level: done Lipid Panel: 2013  PHYSICAL EXAMINATION:  Blood pressure (!) 145/88, pulse 87, temperature 98.5 F (36.9 C), temperature source Oral, resp. rate 18, height 5' 7.75" (1.721 m), weight 163 lb 4.8 oz (74.1 kg), SpO2 100 %. Body mass index is 25.01 kg/m.   General: Patient is a well appearing female in no acute distress HEENT: PERRLA, sclerae anicteric no conjunctival pallor, MMM Neck: supple, no palpable adenopathy Lungs: clear to auscultation bilaterally, no wheezes, rhonchi, or rales Cardiovascular: regular rate rhythm, S1, S2, no murmurs, rubs or gallops Abdomen: Soft, non-tender, non-distended, normoactive bowel sounds, no HSM Extremities: warm and well perfused, no clubbing, cyanosis, or edema Skin: No rashes or lesions Neuro: Non-focal Breasts: S/p Right lumpectomy: Surgical incision healed well with very mild scar tissue. She has lump breast tissue (+) No palpable mass or adenopathy in either axilla   ECOG PERFORMANCE STATUS: 0 - Asymptomatic  LABORATORY DATA: CBC Latest Ref Rng & Units 01/25/2018 07/25/2017 01/23/2017  WBC 3.9 - 10.3 K/uL 6.1 7.3 5.6   Hemoglobin 11.6 - 15.9 g/dL 13.5 14.6 13.9  Hematocrit 34.8 - 46.6 % 41.1 45.2 42.1  Platelets 145 - 400 K/uL 139(L) 158 146    CMP Latest Ref Rng & Units 07/25/2017 01/23/2017 07/10/2016  Glucose 70 - 140 mg/dL 87 90 88  BUN 7 - 26 mg/dL 20 13.8 14.4  Creatinine 0.60 - 1.10 mg/dL 0.86 0.8 0.8  Sodium 136 - 145 mmol/L 140 142 142  Potassium 3.5 - 5.1 mmol/L 3.8 3.7 3.9  Chloride 98 - 109 mmol/L 102 - -  CO2 22 - 29 mmol/L _0 Calcium 8.4 - 10.4 mg/dL 10.9(H) 10.3 10.5(H)  Total Protein 6.4 - 8.3 g/dL 8.5(H) 8.1 7.9  Total Bilirubin 0.2 - 1.2 mg/dL 0.5 0.75 0.65  Alkaline Phos 40 - 150 U/L 100 86 100  AST 5 - 34 U/L _1 ALT 0 - 55 U/L _2 RADIOGRAPHIC STUDIES:   Digital Diagnostic Mammogram [facility] 01/09/18 IMPRESSION: The right breast mass resolved, consistent with a cyst. RECOMMENDATION:  Recommend annual screening mammography.  Diagnostic Mammogram 01/07/18 IMPRESSION: No mammographic evidence of malignancy in the left breast. Right breast 9 o'clock 8 mm probably benign nodule, which may represent a mildly complicated cyst or an mammary lymph node.   Diagnostic mammogram 01/04/17 IMPRESSION: Stable post lumpectomy changes of the right breast. RECOMMENDATION: Recommend continued annual bilateral diagnostic mammographic evaluation.  Mammogram 12/13/2015 IMPRESSION: No findings worrisome for recurrent tumor or developing malignancy.  Bone density scan 02/03/2016:  The BMD measured at Femur Neck Right is 0.790 g/cm2 with a T-score of -1.8. This patient is considered osteopenic according to [facility]) criteria. L- 3, L-4 were excluded due to degenerative changes. There has been no statistically significant change in BMD of left hip since prior exam dated 12/30/2013. Spine was not compared to prior study due to exclusion of vertebral bodies on current exam. Site Region Measured Date Measured Age YA BMD Significant  CHANGE T-score AP Spine L1-L2 02/03/2016 62.6 -1.8 0.961 g/cm2 DualFemur Neck Right 02/03/2016 62.6 -1.8 0.790 g/cm2 World Health Organization [facility]) criteria for post-menopausal, Caucasian Women: Normal T-score at or above -1 SD Osteopenia T-score between -1 and -2.5 SD Osteoporosis T-score at or below -2.5 SD  ASSESSMENT: 62 y.o. female with   1. 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 She asked previously if she needed to take anastrozole for a total of 10 years, giving her early stage and low risk disease, I think it 5 years is probably adequate. We do have recent clinical data that 10 years of aromatase inhibitor decreases disease free survival, however no benefit of overall survival. -She was concerned about the recurrence, after her friend had recurrent breast cancer 6 years out of her initial surgery. She is tolerating anastrozole very well, I think is okay if she wants to continue anastrozole beyond 5 years for up to 7 years if she continues tolerating anastrozole well. She agreed. Plan to complete in 01/2020. -Her 01/07/18 Mammogram showed possible benign cyst, which was confirmed with needle aspiration and repeat mammogram on 01/09/18.  -Lab reviewed, normal CBC except her PLT fluctuates, currently at 139K today. CMP and VitD still pending. She is clinically doing well, her physical exam and 2018 mammogram showed no evidence of recurrence. -Next Screening Mammogram in 12/2018 -It has been 5 years since her diagnosis, I will see her yearly.   2. Osteopenia -Her bone density scan in September 2015 showed osteopenia. -We previously discussed that anastrozole can cause weakening of bone and increase her risk of fracture -Her vitamin  D level was mildly low previously and I previously encouraged her to increase vitamin D supplement to 2000u daily, and exercise regularly, she is compliant, does exercise with weights. -Due to her mild hypercalcemia, she has been off calcium supplement. -Bone density scan was October 2017 with Femur Neck Right T-score of -1.8, no high risk for fracture.  -She is scheduled for Bone Density scan on 03/05/18  3. Hypercalcemia -Her PTH was normal in 2017. I previously encouraged her to drink more water, we'll follow-up her calcium level.  -I previously advised her to avoid calcium products in her diet. Will monitor  -Her Ca and Vit D level is still pending. She will continue Oral Vit D supplement.   4. HTN, Hep C -She'll continue  follow-up with her primary care physician  PLAN:  -Continue Anastrozole, refilled today  -Avoid Ca supplement or calcium rich food -Lab and f/u in one year -Mammogram in 12/2018  All questions were answered. The patient knows to call the clinic with any problems, questions or concerns. I spent 15 minutes counseling the patient face to face. The total time spent in the appointment was 20 minutes.  Oneal Deputy, am acting as scribe for Truitt Merle, MD.   I have reviewed the above documentation for accuracy and completeness, and I agree with the above.    Truitt Merle 01/25/2018 1:31 PM

## 2018-01-25 ENCOUNTER — Inpatient Hospital Stay: Payer: BLUE CROSS/BLUE SHIELD | Attending: Hematology | Admitting: Hematology

## 2018-01-25 ENCOUNTER — Inpatient Hospital Stay: Payer: BLUE CROSS/BLUE SHIELD

## 2018-01-25 ENCOUNTER — Telehealth: Payer: Self-pay | Admitting: Hematology

## 2018-01-25 VITALS — BP 145/88 | HR 87 | Temp 98.5°F | Resp 18 | Ht 67.75 in | Wt 163.3 lb

## 2018-01-25 DIAGNOSIS — Z79811 Long term (current) use of aromatase inhibitors: Secondary | ICD-10-CM | POA: Diagnosis not present

## 2018-01-25 DIAGNOSIS — Z170 Estrogen receptor positive status [ER+]: Secondary | ICD-10-CM | POA: Diagnosis not present

## 2018-01-25 DIAGNOSIS — C50511 Malignant neoplasm of lower-outer quadrant of right female breast: Secondary | ICD-10-CM | POA: Insufficient documentation

## 2018-01-25 DIAGNOSIS — I10 Essential (primary) hypertension: Secondary | ICD-10-CM | POA: Diagnosis not present

## 2018-01-25 DIAGNOSIS — B1920 Unspecified viral hepatitis C without hepatic coma: Secondary | ICD-10-CM | POA: Diagnosis not present

## 2018-01-25 DIAGNOSIS — E559 Vitamin D deficiency, unspecified: Secondary | ICD-10-CM

## 2018-01-25 DIAGNOSIS — M8580 Other specified disorders of bone density and structure, unspecified site: Secondary | ICD-10-CM | POA: Insufficient documentation

## 2018-01-25 DIAGNOSIS — Z9181 History of falling: Secondary | ICD-10-CM

## 2018-01-25 LAB — CBC WITH DIFFERENTIAL/PLATELET
Basophils Absolute: 0.1 10*3/uL (ref 0.0–0.1)
Basophils Relative: 1 %
Eosinophils Absolute: 0.2 10*3/uL (ref 0.0–0.5)
Eosinophils Relative: 3 %
HCT: 41.1 % (ref 34.8–46.6)
Hemoglobin: 13.5 g/dL (ref 11.6–15.9)
Lymphocytes Relative: 22 %
Lymphs Abs: 1.3 10*3/uL (ref 0.9–3.3)
MCH: 29.2 pg (ref 25.1–34.0)
MCHC: 32.9 g/dL (ref 31.5–36.0)
MCV: 88.7 fL (ref 79.5–101.0)
Monocytes Absolute: 0.4 10*3/uL (ref 0.1–0.9)
Monocytes Relative: 7 %
Neutro Abs: 4 10*3/uL (ref 1.5–6.5)
Neutrophils Relative %: 67 %
Platelets: 139 10*3/uL — ABNORMAL LOW (ref 145–400)
RBC: 4.63 MIL/uL (ref 3.70–5.45)
RDW: 14.1 % (ref 11.2–14.5)
WBC: 6.1 10*3/uL (ref 3.9–10.3)

## 2018-01-25 LAB — COMPREHENSIVE METABOLIC PANEL
ALT: 15 U/L (ref 0–44)
AST: 17 U/L (ref 15–41)
Albumin: 4.6 g/dL (ref 3.5–5.0)
Alkaline Phosphatase: 95 U/L (ref 38–126)
Anion gap: 9 (ref 5–15)
BUN: 18 mg/dL (ref 8–23)
CO2: 27 mmol/L (ref 22–32)
Calcium: 10.3 mg/dL (ref 8.9–10.3)
Chloride: 106 mmol/L (ref 98–111)
Creatinine, Ser: 0.77 mg/dL (ref 0.44–1.00)
GFR calc Af Amer: >60 mL/min (ref >60)
GFR calc non Af Amer: >60 mL/min (ref >60)
Glucose, Bld: 89 mg/dL (ref 70–99)
Potassium: 3.7 mmol/L (ref 3.5–5.1)
Sodium: 142 mmol/L (ref 135–145)
Total Bilirubin: 0.7 mg/dL (ref 0.3–1.2)
Total Protein: 7.9 g/dL (ref 6.5–8.1)

## 2018-01-25 MED ORDER — ANASTROZOLE 1 MG PO TABS: 1.0000 mg | ORAL_TABLET | Freq: Every day | ORAL | 3 refills | Status: DC

## 2018-01-25 NOTE — Telephone Encounter (Signed)
Appts scheduled AVS/Calendar printed per 10/4 los °

## 2018-01-26 ENCOUNTER — Encounter: Payer: Self-pay | Admitting: Hematology

## 2018-01-26 LAB — VITAMIN D 25 HYDROXY (VIT D DEFICIENCY, FRACTURES): Vit D, 25-Hydroxy: 31 ng/mL (ref 30.0–100.0)

## 2018-01-28 ENCOUNTER — Telehealth: Payer: Self-pay

## 2018-01-28 NOTE — Telephone Encounter (Signed)
Spoke with patient per Dr. Burr Medico lab results show Vitamin D and calcium levels are normal, no concerns, continue taking Vitd supplement, patient verbalized an understanding.

## 2018-01-28 NOTE — Telephone Encounter (Signed)
-----   Message from Truitt Merle, MD sent at 01/28/2018  9:35 AM EDT ----- Let pt know her lab results, VitD and calcium level normal, no concerns, continue VitD supplement, thanks  Truitt Merle  01/28/2018

## 2018-03-05 ENCOUNTER — Ambulatory Visit
Admission: RE | Admit: 2018-03-05 | Discharge: 2018-03-05 | Disposition: A | Discharge status: Home / Self Care | Payer: BLUE CROSS/BLUE SHIELD | Source: Ambulatory Visit | Attending: Hematology | Admitting: Hematology

## 2018-03-11 ENCOUNTER — Telehealth: Payer: Self-pay

## 2018-03-11 NOTE — Telephone Encounter (Signed)
-----   Message from Truitt Merle, MD sent at 03/09/2018 10:15 PM EST ----- Please let pt know the result, osteopenia similar to 2 years ago, I recommend calcium 500-600mg  and VitD around 1000u daily, thanks   Truitt Merle  03/09/2018

## 2018-03-11 NOTE — Telephone Encounter (Signed)
Spoke with patient per Dr. Burr Medico notified her bone density scan results show osteopenia similar to two years ago.  Instructed her to take Calcium 500 to 600 mg daily and Vitamin D around 1000u daily, patient verbalized an understanding.

## 2019-01-24 ENCOUNTER — Telehealth: Payer: Self-pay | Admitting: Hematology

## 2019-01-24 NOTE — Telephone Encounter (Signed)
R/s appt per 10/1 sch message - per pt they want to wait to see Dr. Burr Medico - scheduled next available . Pt aware of new appt date and time

## 2019-01-27 ENCOUNTER — Inpatient Hospital Stay: Payer: Self-pay

## 2019-01-27 ENCOUNTER — Inpatient Hospital Stay: Payer: Self-pay | Admitting: Hematology

## 2019-02-03 NOTE — Progress Notes (Signed)
[place]   [phone] Fax:([phone]   Clinic Follow up Note   Patient Care Team: Maury Dus, MD as PCP - General (Family Medicine) Arloa Koh, MD (Inactive) as Consulting Physician (Radiation Oncology) Marcy Panning, MD as Consulting Physician (Hematology and Oncology)  Date of Service:  02/06/2019  CHIEF COMPLAINT: Follow-up right breast cancer  SUMMARY OF ONCOLOGIC HISTORY:  DIAGNOSIS: 63 y.o.  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  STAGE:  Cancer of lower-outer quadrant of female breast  Primary site: Breast (Right)  Staging method: AJCC 7th Edition  Clinical: Stage IA (T1c, N0, cM0)  Summary: Stage IA (T1c, N0, cM0)   PRIOR THERAPY: #1On 04/30/2012 patient had a short interval followup of a nodule along the right lower quadrant of the posterior right breast. Interval nodule was seen again along the right lower quadrant. She had an ultrasound done that showed a cyst at the 6:00 position and a 4 x 3 x 4 mm hypoechoic nodule at the 8:00 position 8 cm from the nipple. This was felt to be smaller than a nodule noted in the mammogram and did not correlate to the mammographic findings  #2 Patient had a stereotactic biopsy performed on 06/10/2012. The pathology showed a invasive mammary carcinoma with extravasated mucin. Tumor was low grade, ER positive PR +100% with a proliferation marker Ki-67 of 22%.  #3 s/p right needle loc lumpectomy with right axillary sentinel lymph node biopsy on 08/20/12 with path  1.0 cm IDC with extravasated mucin, 0/3 nodes, ER 100%, PR 100% Her2Neu neg, Ki-67 22%  #3 Oncotype Dx performed on 5/14 with recurrence score of 13 and distant recurrence rate of 8% , in the low risk category.  #4  Patient underwent adjuvant radiation therapy from 10/01/12 through 10/31/12.    #5 Adjuvant Arimidex starting 12/20/12   CURRENT THERAPY: Arimidex daily started  12/20/12  INTERVAL HISTORY:  Sharon Nguyen is here for a follow up of right breast cancer. She was last seen by me 1 year ago. She presents to the clinic alone.  She is clinically doing very well, tolerating anastrozole without any side effects.  She has good appetite, and energy level,.  She denies dyspnea.  She remains physically active at home.  Her weight has been stable, review of system otherwise negative.   MEDICAL HISTORY:  Past Medical History:  Diagnosis Date   Breast cancer ([place])    right 2014   GERD (gastroesophageal reflux disease)    Hepatitis C    Hypertension    Status post radiation therapy 10/01/12-10/31/12   rt breast/4250cGy 17 sessions/ rt breat boost=750cGy /3 sessions   TB (pulmonary tuberculosis)     SURGICAL HISTORY: Past Surgical History:  Procedure Laterality Date   BREAST LUMPECTOMY Right 2014   BREAST LUMPECTOMY WITH NEEDLE LOCALIZATION AND AXILLARY SENTINEL LYMPH NODE BX Right 08/20/2012   Procedure: BREAST LUMPECTOMY WITH NEEDLE LOCALIZATION AND AXILLARY SENTINEL LYMPH NODE BX;  Surgeon: Shann Medal, MD;  Location: [place];  Service: General;  Laterality: Right;   BREAST SURGERY     MANDIBLE FRACTURE SURGERY     THYROID CYST EXCISION     TUBAL LIGATION      I have reviewed the social history and family history with the patient and they are unchanged from previous note.  ALLERGIES:  is allergic to procardia [nifedipine].  MEDICATIONS:  Current Outpatient Medications  Medication Sig Dispense Refill   ALPRAZolam (  XANAX) 0.25 MG tablet Take 1 tablet (0.25 mg total) by mouth at bedtime as needed for anxiety. 15 tablet 0   amLODipine (NORVASC) 10 MG tablet Take 10 mg by mouth daily.     anastrozole (ARIMIDEX) 1 MG tablet Take 1 tablet (1 mg total) by mouth daily. 90 tablet 3   Ascorbic Acid (VITAMIN C) 100 MG tablet Take 100 mg by mouth daily.     cholecalciferol (VITAMIN D3) 25 MCG (1000 UT) tablet Take 1,000 Units by mouth daily.      fish oil-omega-3 fatty acids 1000 MG capsule Take 1 g by mouth daily.     losartan-hydrochlorothiazide (HYZAAR) 100-25 MG per tablet Take 1 tablet by mouth daily.     Multiple Vitamin (MULTIVITAMIN WITH MINERALS) TABS Take 1 tablet by mouth daily.     zinc gluconate 50 MG tablet Take 50 mg by mouth daily.     No current facility-administered medications for this visit.     PHYSICAL EXAMINATION: ECOG PERFORMANCE STATUS: 0 - Asymptomatic  Vitals:   02/06/19 0828  BP: 124/73  Pulse: 85  Resp: 18  Temp: 97.8 F (36.6 C)  SpO2: 95%   Filed Weights   02/06/19 0828  Weight: 157 lb 3.2 oz (71.3 kg)   GENERAL:alert, no distress and comfortable SKIN: skin color, texture, turgor are normal, no rashes or significant lesions EYES: normal, Conjunctiva are pink and non-injected, sclera clear NECK: supple, thyroid normal size, non-tender, without nodularity LYMPH:  no palpable lymphadenopathy in the cervical, axillary  LUNGS: clear to auscultation and percussion with normal breathing effort HEART: regular rate & rhythm and no murmurs and no lower extremity edema ABDOMEN:abdomen soft, non-tender and normal bowel sounds Musculoskeletal:no cyanosis of digits and no clubbing  NEURO: alert & oriented x 3 with fluent speech, no focal motor/sensory deficits Breasts: Breast inspection showed them to be symmetrical with no nipple discharge. Palpation of the breasts and axilla revealed no obvious mass that I could appreciate.   LABORATORY DATA:  I have reviewed the data as listed CBC Latest Ref Rng & Units 02/06/2019 01/25/2018 07/25/2017  WBC 4.0 - 10.5 K/uL 6.7 6.1 7.3  Hemoglobin 12.0 - 15.0 g/dL 13.8 13.5 14.6  Hematocrit 36.0 - 46.0 % 41.9 41.1 45.2  Platelets 150 - 400 K/uL 140(L) 139(L) 158     CMP Latest Ref Rng & Units 02/06/2019 01/25/2018 07/25/2017  Glucose 70 - 99 mg/dL 91 89 87  BUN 8 - 23 mg/dL 24(H) 18 20  Creatinine 0.44 - 1.00 mg/dL 1.11(H) 0.77 0.86  Sodium 135 - 145 mmol/L  140 142 140  Potassium 3.5 - 5.1 mmol/L 3.5 3.7 3.8  Chloride 98 - 111 mmol/L 102 106 102  CO2 22 - 32 mmol/L '25 27 28  ' Calcium 8.9 - 10.3 mg/dL 10.7(H) 10.3 10.9(H)  Total Protein 6.5 - 8.1 g/dL 8.0 7.9 8.5(H)  Total Bilirubin 0.3 - 1.2 mg/dL 1.0 0.7 0.5  Alkaline Phos 38 - 126 U/L 93 95 100  AST 15 - 41 U/L '22 17 20  ' ALT 0 - 44 U/L '25 15 26      ' RADIOGRAPHIC STUDIES: I have personally reviewed the radiological images as listed and agreed with the findings in the report. No results found.   ASSESSMENT & PLAN:  Sharon Nguyen is a 63 y.o. female with   1. Stage I (T1N0) IDC ER 100%, PR 100% Her2Neu neg, Ki-67 22%, Oncotype RS 13 -Her Oncotype recurrence score was 13, low risk, predicts 10 year  distant recurrence rate of 8% with tamoxifen -She will continue anastrozole, a total 5-10 years adjuvant therapy. She asked previously if she needed to take anastrozole for a total of 10 years, giving her early stage and low risk disease, I think it 5 years is probably adequate. We do have recent clinical data that 10 years of aromatase inhibitor decreases disease free survival, however no benefit of overall survival. -She was concerned about the recurrence, after her friend had recurrent breast cancer 6 years out of her initial surgery. She is tolerating anastrozole very well, I think is okay if she wants to continue anastrozole beyond 5 years for up to 7 years if she continues tolerating anastrozole well. She agreed. Plan to complete in 01/2020. -She is clinically doing well, asymptomatic, lab reviewed, her physical exam was unremarkable today. -She is overdue for screening mammogram, I ordered today, and encouraged her to get it done this months. -It has been 5 years since her diagnosis, I will see her yearly.   2. Osteopenia -Her bone density scan in September 2015 showed osteopenia. -We previously discussed that anastrozole can cause weakening of bone and increase her risk of fracture -Her  vitamin D level was mildly low previously and I previously encouraged her to increase vitamin D supplement to 2000u daily, and exercise regularly, she is compliant, does exercise with weights. -Due to her mild hypercalcemia, she has been off calcium supplement. -Bone density scan was October 2017 with Femur Neck Right T-score of -1.8, no high risk for fracture.  -For bone density scan from November 2019 showed T score -2.0, no high risk for fracture, she will continue low dose calcium and vitamin D  3. Hypercalcemia -Her PTH was normal in 2017. I previously encouraged her to drink more water, we'll follow-up her calcium level.  -I encouraged her to take low-dose calcium and continue vitamin D supplement -f/u with PCP   4. HTN, Hep C -She'll continue follow-up with her primary care physician  PLAN: -Continue Anastrozole, refilled today  -Lab and f/u in one year -Mammogram in 2-4 weeks at [facility]    No problem-specific Assessment & Plan notes found for this encounter.   Orders Placed This Encounter  Procedures   MM Digital Screening    Standing Status:   Future    Standing Expiration Date:   02/06/2020    Order Specific Question:   Reason for Exam (SYMPTOM  OR DIAGNOSIS REQUIRED)    Answer:   screening    Order Specific Question:   Preferred imaging location?    Answer:   [facility]   VITAMIN D 25 Hydroxy (Vit-D Deficiency, Fractures)   All questions were answered. The patient knows to call the clinic with any problems, questions or concerns. No barriers to learning was detected. I spent 15 minutes counseling the patient face to face. The total time spent in the appointment was 20 minutes and more than 50% was on counseling and review of test results     Truitt Merle, MD 02/06/2019   I, Joslyn Devon, am acting as scribe for Truitt Merle, MD.   I have reviewed the above documentation for accuracy and completeness, and I agree with the above.

## 2019-02-06 ENCOUNTER — Inpatient Hospital Stay: Payer: Medicare Other | Attending: Hematology

## 2019-02-06 ENCOUNTER — Telehealth: Payer: Self-pay | Admitting: Hematology

## 2019-02-06 ENCOUNTER — Inpatient Hospital Stay (HOSPITAL_BASED_OUTPATIENT_CLINIC_OR_DEPARTMENT_OTHER): Payer: Medicare Other | Admitting: Hematology

## 2019-02-06 ENCOUNTER — Encounter: Payer: Self-pay | Admitting: Hematology

## 2019-02-06 ENCOUNTER — Other Ambulatory Visit: Payer: Self-pay

## 2019-02-06 VITALS — BP 124/73 | HR 85 | Temp 97.8°F | Resp 18 | Ht 67.75 in | Wt 157.2 lb

## 2019-02-06 DIAGNOSIS — Z79811 Long term (current) use of aromatase inhibitors: Secondary | ICD-10-CM | POA: Insufficient documentation

## 2019-02-06 DIAGNOSIS — I10 Essential (primary) hypertension: Secondary | ICD-10-CM | POA: Diagnosis not present

## 2019-02-06 DIAGNOSIS — C50511 Malignant neoplasm of lower-outer quadrant of right female breast: Secondary | ICD-10-CM | POA: Diagnosis present

## 2019-02-06 DIAGNOSIS — Z170 Estrogen receptor positive status [ER+]: Secondary | ICD-10-CM | POA: Diagnosis not present

## 2019-02-06 DIAGNOSIS — E559 Vitamin D deficiency, unspecified: Secondary | ICD-10-CM

## 2019-02-06 DIAGNOSIS — M8580 Other specified disorders of bone density and structure, unspecified site: Secondary | ICD-10-CM | POA: Insufficient documentation

## 2019-02-06 DIAGNOSIS — E2839 Other primary ovarian failure: Secondary | ICD-10-CM | POA: Diagnosis not present

## 2019-02-06 LAB — CBC WITH DIFFERENTIAL/PLATELET
Abs Immature Granulocytes: 0.02 10*3/uL (ref 0.00–0.07)
Basophils Absolute: 0 10*3/uL (ref 0.0–0.1)
Basophils Relative: 1 %
Eosinophils Absolute: 0.2 10*3/uL (ref 0.0–0.5)
Eosinophils Relative: 4 %
HCT: 41.9 % (ref 36.0–46.0)
Hemoglobin: 13.8 g/dL (ref 12.0–15.0)
Immature Granulocytes: 0 %
Lymphocytes Relative: 27 %
Lymphs Abs: 1.8 10*3/uL (ref 0.7–4.0)
MCH: 29.2 pg (ref 26.0–34.0)
MCHC: 32.9 g/dL (ref 30.0–36.0)
MCV: 88.6 fL (ref 80.0–100.0)
Monocytes Absolute: 0.7 10*3/uL (ref 0.1–1.0)
Monocytes Relative: 11 %
Neutro Abs: 3.8 10*3/uL (ref 1.7–7.7)
Neutrophils Relative %: 57 %
Platelets: 140 10*3/uL — ABNORMAL LOW (ref 150–400)
RBC: 4.73 MIL/uL (ref 3.87–5.11)
RDW: 13.4 % (ref 11.5–15.5)
WBC: 6.7 10*3/uL (ref 4.0–10.5)
nRBC: 0 % (ref 0.0–0.2)

## 2019-02-06 LAB — COMPREHENSIVE METABOLIC PANEL
ALT: 25 U/L (ref 0–44)
AST: 22 U/L (ref 15–41)
Albumin: 4.6 g/dL (ref 3.5–5.0)
Alkaline Phosphatase: 93 U/L (ref 38–126)
Anion gap: 13 (ref 5–15)
BUN: 24 mg/dL — ABNORMAL HIGH (ref 8–23)
CO2: 25 mmol/L (ref 22–32)
Calcium: 10.7 mg/dL — ABNORMAL HIGH (ref 8.9–10.3)
Chloride: 102 mmol/L (ref 98–111)
Creatinine, Ser: 1.11 mg/dL — ABNORMAL HIGH (ref 0.44–1.00)
GFR calc Af Amer: >60 mL/min (ref >60)
GFR calc non Af Amer: 52 mL/min — ABNORMAL LOW (ref >60)
Glucose, Bld: 91 mg/dL (ref 70–99)
Potassium: 3.5 mmol/L (ref 3.5–5.1)
Sodium: 140 mmol/L (ref 135–145)
Total Bilirubin: 1 mg/dL (ref 0.3–1.2)
Total Protein: 8 g/dL (ref 6.5–8.1)

## 2019-02-06 LAB — VITAMIN D 25 HYDROXY (VIT D DEFICIENCY, FRACTURES): Vit D, 25-Hydroxy: 28.3 ng/mL — ABNORMAL LOW (ref 30–100)

## 2019-02-06 MED ORDER — ANASTROZOLE 1 MG PO TABS: 1.0000 mg | ORAL_TABLET | Freq: Every day | ORAL | 3 refills | Status: DC

## 2019-02-06 NOTE — Telephone Encounter (Signed)
Scheduled appt per 10/15 los - pt aware of appt date and time - per pt no print out needed.   

## 2019-02-07 ENCOUNTER — Telehealth: Payer: Self-pay | Admitting: *Deleted

## 2019-02-07 NOTE — Telephone Encounter (Signed)
Notified of message below- verbalized understanding

## 2019-02-07 NOTE — Telephone Encounter (Signed)
-----   Message from Zola Button, RN sent at 02/07/2019  8:55 AM EDT ----- Regarding: Result Call Thank you! ----- Message ----- From: Truitt Merle, MD Sent: 02/06/2019   4:46 PM EDT To: [facility]  Please let pt know her lab results, Cr slightly elevated, encourage her to drink more fluids, vitD slightly low and increase Vit D intake, thanks   Truitt Merle  02/06/2019

## 2019-03-04 ENCOUNTER — Other Ambulatory Visit: Payer: Self-pay

## 2019-03-04 ENCOUNTER — Ambulatory Visit
Admission: RE | Admit: 2019-03-04 | Discharge: 2019-03-04 | Disposition: A | Discharge status: Home / Self Care | Payer: Medicare Other | Source: Ambulatory Visit | Attending: Hematology | Admitting: Hematology

## 2019-03-04 DIAGNOSIS — E2839 Other primary ovarian failure: Secondary | ICD-10-CM

## 2019-05-20 ENCOUNTER — Ambulatory Visit: Payer: Medicare Other

## 2019-05-29 ENCOUNTER — Ambulatory Visit: Payer: Medicare Other | Attending: Internal Medicine

## 2019-05-29 DIAGNOSIS — Z23 Encounter for immunization: Secondary | ICD-10-CM

## 2019-05-29 NOTE — Progress Notes (Signed)
   Covid-19 Vaccination Clinic  Name:  Sharon Nguyen    MRN: [id number] DOB: [birth year]  05/29/2019  [name] was observed post Covid-19 immunization for 15 minutes without incidence. She was provided with Vaccine Information Sheet and instruction to access the V-Safe system.   Ms. Trunk was instructed to call 911 with any severe reactions post vaccine: Marland Kitchen Difficulty breathing  . Swelling of your face and throat  . A fast heartbeat  . A bad rash all over your body  . Dizziness and weakness    Immunizations Administered    Name Date Dose VIS Date Route   Pfizer COVID-19 Vaccine 05/29/2019  8:59 AM 0.3 mL 04/04/2019 Intramuscular   Manufacturer: [place]   Lot: [id number]   [place]: [id number]

## 2019-06-23 ENCOUNTER — Ambulatory Visit: Payer: Medicare Other | Attending: Internal Medicine

## 2019-06-23 DIAGNOSIS — Z23 Encounter for immunization: Secondary | ICD-10-CM | POA: Insufficient documentation

## 2019-06-23 NOTE — Progress Notes (Signed)
   Covid-19 Vaccination Clinic  Name:  Sharon Nguyen    MRN: [id number] DOB: 08/09/1955  06/23/2019  [name] was observed post Covid-19 immunization for 15 minutes without incidence. She was provided with Vaccine Information Sheet and instruction to access the V-Safe system.   [name] was instructed to call 911 with any severe reactions post vaccine: Marland Kitchen Difficulty breathing  . Swelling of your face and throat  . A fast heartbeat  . A bad rash all over your body  . Dizziness and weakness    Immunizations Administered    Name Date Dose VIS Date Route   Pfizer COVID-19 Vaccine 06/23/2019  1:22 PM 0.3 mL 04/04/2019 Intramuscular   Manufacturer: [place]   Lot: [id number]   [place]: [id number]

## 2020-02-04 NOTE — Progress Notes (Signed)
[place]   Telephone:(336) [phone] Fax:(336) [phone]   Clinic Follow up Note   Patient Care Team: Maury Dus, MD as PCP - General (Family Medicine) Arloa Koh, MD (Inactive) as Consulting Physician (Radiation Oncology) Marcy Panning, MD as Consulting Physician (Hematology and Oncology)  Date of Service:  02/06/2020  CHIEF COMPLAINT: F/u of right breast cancer   Oncology History: #1 On 04/30/2012 patient had a short interval followup of a nodule along the right lower quadrant of the posterior right breast. Interval nodule was seen again along the right lower quadrant. She had an ultrasound done that showed a cyst at the 6:00 position and a 4 x 3 x 4 mm hypoechoic nodule at the 8:00 position 8 cm from the nipple. This was felt to be smaller than a nodule noted in the mammogram and did not correlate to the mammographic findings  #2 Patient had a stereotactic biopsy performed on 06/10/2012. The pathology showed a invasive mammary carcinoma with extravasated mucin. Tumor was low grade, ER positive PR +100% with a proliferation marker Ki-67 of 22%.  #3 s/p right needle loc lumpectomy with right axillary sentinel lymph node biopsy on 08/20/12 with path 1.0 cm IDC with extravasated mucin, 0/3 nodes, ER 100%, PR 100% Her2Neu neg, Ki-67 22%  #3 Oncotype Dx performed on 5/14 withrecurrencescore of 13 and distant recurrence rate of 8% , in the low risk category.  #4 Patient underwent adjuvant radiation therapy from 10/01/12 through 10/31/12.   #5 Adjuvant Arimidex starting 12/20/12   CURRENT THERAPY:  Adjuvant Arimidex starting 12/20/12  INTERVAL HISTORY:  Sharon Nguyen is here for a follow up of right breast cancer. She was last seen by me 1 year ago. She presents to the clinic alone.  She is doing well clinically, denies any pain, or other new symptoms.  She has good appetite and energy level, she is tolerating anastrozole without any significant side  effects.  Weight is stable.  All other systems were reviewed with the patient and are negative.  MEDICAL HISTORY:  Past Medical History:  Diagnosis Date  . Breast cancer ([place])    right 2014  . GERD (gastroesophageal reflux disease)   . Hepatitis C   . Hypertension   . Status post radiation therapy 10/01/12-10/31/12   rt breast/4250cGy 17 sessions/ rt breat boost=750cGy /3 sessions  . TB (pulmonary tuberculosis)     SURGICAL HISTORY: Past Surgical History:  Procedure Laterality Date  . BREAST LUMPECTOMY Right 2014  . BREAST LUMPECTOMY WITH NEEDLE LOCALIZATION AND AXILLARY SENTINEL LYMPH NODE BX Right 08/20/2012   Procedure: BREAST LUMPECTOMY WITH NEEDLE LOCALIZATION AND AXILLARY SENTINEL LYMPH NODE BX;  Surgeon: Shann Medal, MD;  Location: [place];  Service: General;  Laterality: Right;  . BREAST SURGERY    . MANDIBLE FRACTURE SURGERY    . THYROID CYST EXCISION    . TUBAL LIGATION      I have reviewed the social history and family history with the patient and they are unchanged from previous note.  ALLERGIES:  is allergic to procardia [nifedipine].  MEDICATIONS:  Current Outpatient Medications  Medication Sig Dispense Refill  . ALPRAZolam (XANAX) 0.25 MG tablet Take 1 tablet (0.25 mg total) by mouth at bedtime as needed for anxiety. 15 tablet 0  . amLODipine (NORVASC) 10 MG tablet Take 10 mg by mouth daily.    Marland Kitchen anastrozole (ARIMIDEX) 1 MG tablet Take 1 tablet (1 mg total) by mouth daily. 90 tablet 3  . Ascorbic Acid (  VITAMIN C) 100 MG tablet Take 100 mg by mouth daily.    . cholecalciferol (VITAMIN D3) 25 MCG (1000 UT) tablet Take 1,000 Units by mouth daily.    . fish oil-omega-3 fatty acids 1000 MG capsule Take 1 g by mouth daily.    Marland Kitchen losartan-hydrochlorothiazide (HYZAAR) 100-25 MG per tablet Take 1 tablet by mouth daily.    . Multiple Vitamin (MULTIVITAMIN WITH MINERALS) TABS Take 1 tablet by mouth daily.    Marland Kitchen zinc gluconate 50 MG tablet Take 50 mg by mouth daily.      Current Facility-Administered Medications  Medication Dose Route Frequency Provider Last Rate Last Admin  . influenza vaccine adjuvanted (FLUAD) injection 0.5 mL  0.5 mL Intramuscular Once Truitt Merle, MD        PHYSICAL EXAMINATION: ECOG PERFORMANCE STATUS: 0 - Asymptomatic  Vitals:   02/06/20 0810  BP: 136/87  Pulse: 75  Resp: 16  Temp: (!) 97.2 F (36.2 C)  SpO2: 100%   Filed Weights   02/06/20 0810  Weight: 161 lb (73 kg)    GENERAL:alert, no distress and comfortable SKIN: skin color, texture, turgor are normal, no rashes or significant lesions EYES: normal, Conjunctiva are pink and non-injected, sclera clear NECK: supple, thyroid normal size, non-tender, without nodularity LYMPH:  no palpable lymphadenopathy in the cervical, axillary  LUNGS: clear to auscultation and percussion with normal breathing effort HEART: regular rate & rhythm and no murmurs and no lower extremity edema ABDOMEN:abdomen soft, non-tender and normal bowel sounds Musculoskeletal:no cyanosis of digits and no clubbing  NEURO: alert & oriented x 3 with fluent speech, no focal motor/sensory deficits Breasts: Breast inspection showed them to be symmetrical with no nipple discharge. Palpation of the breasts and axilla revealed no obvious mass that I could appreciate.   LABORATORY DATA:  I have reviewed the data as listed CBC Latest Ref Rng & Units 02/06/2020 02/06/2019 01/25/2018  WBC 4.0 - 10.5 K/uL 6.4 6.7 6.1  Hemoglobin 12.0 - 15.0 g/dL 14.0 13.8 13.5  Hematocrit 36 - 46 % 42.2 41.9 41.1  Platelets 150 - 400 K/uL 151 140(L) 139(L)     CMP Latest Ref Rng & Units 02/06/2020 02/06/2019 01/25/2018  Glucose 70 - 99 mg/dL 86 91 89  BUN 8 - 23 mg/dL 16 24(H) 18  Creatinine 0.44 - 1.00 mg/dL 0.76 1.11(H) 0.77  Sodium 135 - 145 mmol/L 141 140 142  Potassium 3.5 - 5.1 mmol/L 3.8 3.5 3.7  Chloride 98 - 111 mmol/L 107 102 106  CO2 22 - 32 mmol/L _0 Calcium 8.9 - 10.3 mg/dL 10.1 10.7(H) 10.3   Total Protein 6.5 - 8.1 g/dL 7.5 8.0 7.9  Total Bilirubin 0.3 - 1.2 mg/dL 0.5 1.0 0.7  Alkaline Phos 38 - 126 U/L 77 93 95  AST 15 - 41 U/L _1 ALT 0 - 44 U/L _2 RADIOGRAPHIC STUDIES: I have personally reviewed the radiological images as listed and agreed with the findings in the report. No results found.   ASSESSMENT & PLAN:  Sharon Nguyen is a 64 y.o. female with    1. Stage I (T1N0) IDC ER 100%, PR 100% Her2Neu neg, Ki-67 22%, Oncotype RS 13 -She was diagnosed in 05/2012 with right breast cancer. She was treated with right lumpectomy, adjuvant radiation in 2014. She is being treated with anastrozole since 11/2012. Plan to complete in 01/2020. -She is clinically doing very well, asymptomatic.  Exam was  unremarkable -Lab reviewed, no concerns  -last mammogram in 02/2019 was unremarkable -We will stop anastrozole, she completed a total of 7 years treatment -We reviewed breast cancer surveillance again, including annual screening mammogram, breast exam and office visit.  Due to her advanced breast tissue, I also recommend screening breast MRI.  We will check if her insurance covers it. -Please call for B12 side effects.f/u in one year    2. Osteopenia -12/2013 DEXA showed osteopenia (T-score -1.7), her 01/2016 DEXA (T-score of -1.8) and her 02/2018 DEXA progressed to T score -2.0).  -I encouraged her to continue Vitamin D.  -next DEXA scan in a few months   3. Hypercalcemia -Her PTH was normal in 2017. Her Calcium has been intermittently elevated in the past 2 years.  -f/u with PCP    4. HTN, Hep C -She'll continue follow-up with her primary care physician  PLAN: -will stop anastrozole, she completed 7 years treatment  -Lab and f/u in one year with NP  -Mammogram and DEXA at [facility] in a few month -screening breast MRI next year, 6 months after mammogram   No problem-specific Assessment & Plan notes found for this encounter.   Orders Placed This  Encounter  Procedures  . MR BREAST BILATERAL W WO CONTRAST INC CAD    PT WILL NEED UPDATED MAMMO PRIOR TO SCHEDULING MRI REQUESTED DATE 09/05/2020  [employer] MCR Epic order PF: 03/04/2019 @ [facility]   PT WILL NEED UPDATED MAMMO PRIOR TO SCHEDULING MRI NO CYCLE WT: 161  HT: 5'7 NO NEEDS/NO CLAUS/NO METAL IN EYES/NO IMPLANTS/NO GLUCOSE MONITOR, SPINAL STIMULATOR, OR INJECTORS/NO BULLETS OR BB'S IN BODY/NO BRAIN, HEART, EYE OR EAR SX/PREV SX 2014/NO TO COVID/[facility] W/ PT/ORDER CHECKED 02/06/2020 [facility]  PT WILL NEED UPDATED MAMMO PRIOR TO SCHEDULING MRI    Standing Status:   Future    Standing Expiration Date:   02/05/2021    Order Specific Question:   If indicated for the ordered procedure, I authorize the administration of contrast media per Radiology protocol    Answer:   Yes    Order Specific Question:   What is the patient's sedation requirement?    Answer:   No Sedation    Order Specific Question:   Does the patient have a pacemaker or implanted devices?    Answer:   No    Order Specific Question:   Radiology Contrast Protocol - do NOT remove file path    Answer:   [email or web address]    Order Specific Question:   Preferred imaging location?    Answer:   [facility] (table limit-550lbs)  . DG Bone Density    [facility] mcr Pf: 01/10/18_0  Wt 161-stop calc/multi 48 hrs prior / no needs dp w pt   Epic order     Standing Status:   Future    Standing Expiration Date:   02/05/2021    Order Specific Question:   Reason for Exam (SYMPTOM  OR DIAGNOSIS REQUIRED)    Answer:   screening    Order Specific Question:   Preferred imaging location?    Answer:   [facility]  . MM 3D SCREEN BREAST BILATERAL    [facility] a[facility] mcr Pf: 03/04/19_1 / no issues / no needs / no hx br ca / no to covid-vax: yes last dose: 12/29/19  / tomo dp w pt     Standing Status:   Future    Standing Expiration Date:   02/05/2021    Order Specific Question:  Reason for Exam (SYMPTOM  OR  DIAGNOSIS REQUIRED)    Answer:   screening    Order Specific Question:   Preferred imaging location?    Answer:   [facility]   All questions were answered. The patient knows to call the clinic with any problems, questions or concerns. No barriers to learning was detected. The total time spent in the appointment was 30 minutes.     Truitt Merle, MD 02/06/2020   I, Joslyn Devon, am acting as scribe for Truitt Merle, MD.   I have reviewed the above documentation for accuracy and completeness, and I agree with the above.

## 2020-02-06 ENCOUNTER — Other Ambulatory Visit: Payer: Self-pay | Admitting: Hematology

## 2020-02-06 ENCOUNTER — Encounter: Payer: Self-pay | Admitting: Hematology

## 2020-02-06 ENCOUNTER — Inpatient Hospital Stay: Payer: Medicare Other

## 2020-02-06 ENCOUNTER — Other Ambulatory Visit: Payer: Self-pay

## 2020-02-06 ENCOUNTER — Telehealth: Payer: Self-pay | Admitting: Hematology

## 2020-02-06 ENCOUNTER — Inpatient Hospital Stay: Payer: Medicare Other | Attending: Hematology | Admitting: Hematology

## 2020-02-06 VITALS — BP 136/87 | HR 75 | Temp 97.2°F | Resp 16 | Ht 67.75 in | Wt 161.0 lb

## 2020-02-06 DIAGNOSIS — C50511 Malignant neoplasm of lower-outer quadrant of right female breast: Secondary | ICD-10-CM

## 2020-02-06 DIAGNOSIS — E2839 Other primary ovarian failure: Secondary | ICD-10-CM | POA: Diagnosis not present

## 2020-02-06 DIAGNOSIS — E559 Vitamin D deficiency, unspecified: Secondary | ICD-10-CM

## 2020-02-06 DIAGNOSIS — B1920 Unspecified viral hepatitis C without hepatic coma: Secondary | ICD-10-CM | POA: Insufficient documentation

## 2020-02-06 DIAGNOSIS — Z170 Estrogen receptor positive status [ER+]: Secondary | ICD-10-CM

## 2020-02-06 DIAGNOSIS — Z1231 Encounter for screening mammogram for malignant neoplasm of breast: Secondary | ICD-10-CM | POA: Diagnosis not present

## 2020-02-06 DIAGNOSIS — Z923 Personal history of irradiation: Secondary | ICD-10-CM | POA: Insufficient documentation

## 2020-02-06 DIAGNOSIS — Z853 Personal history of malignant neoplasm of breast: Secondary | ICD-10-CM | POA: Insufficient documentation

## 2020-02-06 DIAGNOSIS — Z23 Encounter for immunization: Secondary | ICD-10-CM

## 2020-02-06 DIAGNOSIS — M8580 Other specified disorders of bone density and structure, unspecified site: Secondary | ICD-10-CM | POA: Insufficient documentation

## 2020-02-06 LAB — COMPREHENSIVE METABOLIC PANEL
ALT: 19 U/L (ref 0–44)
AST: 19 U/L (ref 15–41)
Albumin: 4.2 g/dL (ref 3.5–5.0)
Alkaline Phosphatase: 77 U/L (ref 38–126)
Anion gap: 8 (ref 5–15)
BUN: 16 mg/dL (ref 8–23)
CO2: 26 mmol/L (ref 22–32)
Calcium: 10.1 mg/dL (ref 8.9–10.3)
Chloride: 107 mmol/L (ref 98–111)
Creatinine, Ser: 0.76 mg/dL (ref 0.44–1.00)
GFR, Estimated: >60 mL/min (ref >60)
Glucose, Bld: 86 mg/dL (ref 70–99)
Potassium: 3.8 mmol/L (ref 3.5–5.1)
Sodium: 141 mmol/L (ref 135–145)
Total Bilirubin: 0.5 mg/dL (ref 0.3–1.2)
Total Protein: 7.5 g/dL (ref 6.5–8.1)

## 2020-02-06 LAB — CBC WITH DIFFERENTIAL/PLATELET
Abs Immature Granulocytes: 0.01 10*3/uL (ref 0.00–0.07)
Basophils Absolute: 0.1 10*3/uL (ref 0.0–0.1)
Basophils Relative: 1 %
Eosinophils Absolute: 0.2 10*3/uL (ref 0.0–0.5)
Eosinophils Relative: 3 %
HCT: 42.2 % (ref 36.0–46.0)
Hemoglobin: 14 g/dL (ref 12.0–15.0)
Immature Granulocytes: 0 %
Lymphocytes Relative: 28 %
Lymphs Abs: 1.8 10*3/uL (ref 0.7–4.0)
MCH: 29.8 pg (ref 26.0–34.0)
MCHC: 33.2 g/dL (ref 30.0–36.0)
MCV: 89.8 fL (ref 80.0–100.0)
Monocytes Absolute: 0.7 10*3/uL (ref 0.1–1.0)
Monocytes Relative: 12 %
Neutro Abs: 3.6 10*3/uL (ref 1.7–7.7)
Neutrophils Relative %: 56 %
Platelets: 151 10*3/uL (ref 150–400)
RBC: 4.7 MIL/uL (ref 3.87–5.11)
RDW: 13.1 % (ref 11.5–15.5)
WBC: 6.4 10*3/uL (ref 4.0–10.5)
nRBC: 0 % (ref 0.0–0.2)

## 2020-02-06 LAB — VITAMIN D 25 HYDROXY (VIT D DEFICIENCY, FRACTURES): Vit D, 25-Hydroxy: 21.73 ng/mL — ABNORMAL LOW (ref 30–100)

## 2020-02-06 MED ORDER — INFLUENZA VAC A&B SA ADJ QUAD 0.5 ML IM PRSY
PREFILLED_SYRINGE | INTRAMUSCULAR | Status: AC
  Filled 2020-02-06: qty 0.5

## 2020-02-06 MED ORDER — INFLUENZA VAC A&B SA ADJ QUAD 0.5 ML IM PRSY: 0.5000 mL | PREFILLED_SYRINGE | Freq: Once | INTRAMUSCULAR | Status: DC

## 2020-02-06 NOTE — Progress Notes (Signed)
Ms Mally states last year she had a local reaction to the flu shot.  She received that at a [facility].  The pharmacist told her they would give her a different flu shot this year.  She decline the flu shot here and will go back to [facility].

## 2020-02-06 NOTE — Telephone Encounter (Signed)
Scheduled per 10/15 los. Printed avs and calendar for pt.

## 2020-02-07 ENCOUNTER — Encounter: Payer: Self-pay | Admitting: Hematology

## 2020-04-30 DIAGNOSIS — Z20822 Contact with and (suspected) exposure to covid-19: Secondary | ICD-10-CM | POA: Diagnosis not present

## 2020-05-25 ENCOUNTER — Ambulatory Visit
Admission: RE | Admit: 2020-05-25 | Discharge: 2020-05-25 | Disposition: A | Discharge status: Home / Self Care | Payer: Medicare Other | Source: Ambulatory Visit | Attending: Hematology | Admitting: Hematology

## 2020-05-25 ENCOUNTER — Other Ambulatory Visit: Payer: Self-pay

## 2020-05-25 ENCOUNTER — Ambulatory Visit: Payer: Medicare Other

## 2020-05-25 DIAGNOSIS — Z1231 Encounter for screening mammogram for malignant neoplasm of breast: Secondary | ICD-10-CM | POA: Diagnosis not present

## 2020-05-25 DIAGNOSIS — E2839 Other primary ovarian failure: Secondary | ICD-10-CM

## 2020-05-25 DIAGNOSIS — M8589 Other specified disorders of bone density and structure, multiple sites: Secondary | ICD-10-CM | POA: Diagnosis not present

## 2020-05-26 ENCOUNTER — Telehealth: Payer: Self-pay

## 2020-05-26 NOTE — Telephone Encounter (Signed)
Spoke with pt gave her DEXA scan results and instructions to continue medication calcium and vit D as ordered also encouraged pt to continue or add weight bearing activity pt states she is taking pilates regularly

## 2020-05-26 NOTE — Telephone Encounter (Signed)
-----   Message from Alla Feeling, NP sent at 05/26/2020  8:46 AM EST ----- Please let her know DEXA is slightly better from 2019, but overall stable since 2015. Continue calcium and vitamin D, plus weight bearing exercise such as walking.   Thanks, Regan Rakers, NP

## 2020-06-15 ENCOUNTER — Telehealth: Payer: Self-pay | Admitting: Nurse Practitioner

## 2020-06-15 NOTE — Telephone Encounter (Signed)
Moved upcoming appointment due to provider's template. Patient is aware of changes. 

## 2020-07-20 DIAGNOSIS — Z20822 Contact with and (suspected) exposure to covid-19: Secondary | ICD-10-CM | POA: Diagnosis not present

## 2020-09-03 DIAGNOSIS — M79662 Pain in left lower leg: Secondary | ICD-10-CM | POA: Diagnosis not present

## 2020-09-03 DIAGNOSIS — R8290 Unspecified abnormal findings in urine: Secondary | ICD-10-CM | POA: Diagnosis not present

## 2020-09-03 DIAGNOSIS — M25512 Pain in left shoulder: Secondary | ICD-10-CM | POA: Diagnosis not present

## 2020-09-05 ENCOUNTER — Ambulatory Visit
Admission: RE | Admit: 2020-09-05 | Discharge: 2020-09-05 | Disposition: A | Discharge status: Home / Self Care | Payer: Medicare Other | Source: Ambulatory Visit | Attending: Hematology | Admitting: Hematology

## 2020-09-05 ENCOUNTER — Other Ambulatory Visit: Payer: Self-pay

## 2020-09-05 DIAGNOSIS — C50511 Malignant neoplasm of lower-outer quadrant of right female breast: Secondary | ICD-10-CM

## 2020-09-05 DIAGNOSIS — Z170 Estrogen receptor positive status [ER+]: Secondary | ICD-10-CM

## 2020-09-05 DIAGNOSIS — N6489 Other specified disorders of breast: Secondary | ICD-10-CM | POA: Diagnosis not present

## 2020-09-05 MED ORDER — GADOBUTROL 1 MMOL/ML IV SOLN
7.0000 mL | Freq: Once | INTRAVENOUS | Status: AC | PRN
  Administered 2020-09-05: 7 mL via INTRAVENOUS

## 2020-09-06 ENCOUNTER — Other Ambulatory Visit: Payer: Self-pay | Admitting: Hematology

## 2020-09-06 DIAGNOSIS — Z170 Estrogen receptor positive status [ER+]: Secondary | ICD-10-CM

## 2020-09-06 DIAGNOSIS — C50511 Malignant neoplasm of lower-outer quadrant of right female breast: Secondary | ICD-10-CM

## 2020-09-10 ENCOUNTER — Other Ambulatory Visit: Payer: Self-pay

## 2020-09-10 ENCOUNTER — Ambulatory Visit
Admission: RE | Admit: 2020-09-10 | Discharge: 2020-09-10 | Disposition: A | Discharge status: Home / Self Care | Payer: Medicare Other | Source: Ambulatory Visit | Attending: Hematology | Admitting: Hematology

## 2020-09-10 ENCOUNTER — Other Ambulatory Visit (HOSPITAL_COMMUNITY): Payer: Self-pay | Admitting: Diagnostic Radiology

## 2020-09-10 DIAGNOSIS — N6011 Diffuse cystic mastopathy of right breast: Secondary | ICD-10-CM | POA: Diagnosis not present

## 2020-09-10 DIAGNOSIS — C50511 Malignant neoplasm of lower-outer quadrant of right female breast: Secondary | ICD-10-CM

## 2020-09-10 DIAGNOSIS — R928 Other abnormal and inconclusive findings on diagnostic imaging of breast: Secondary | ICD-10-CM | POA: Diagnosis not present

## 2020-09-10 DIAGNOSIS — Z170 Estrogen receptor positive status [ER+]: Secondary | ICD-10-CM

## 2020-09-10 MED ORDER — GADOBUTROL 1 MMOL/ML IV SOLN
7.0000 mL | Freq: Once | INTRAVENOUS | Status: AC | PRN
  Administered 2020-09-10: 7 mL via INTRAVENOUS

## 2020-09-13 DIAGNOSIS — M25512 Pain in left shoulder: Secondary | ICD-10-CM | POA: Diagnosis not present

## 2020-10-06 DIAGNOSIS — Z1389 Encounter for screening for other disorder: Secondary | ICD-10-CM | POA: Diagnosis not present

## 2020-10-06 DIAGNOSIS — M8588 Other specified disorders of bone density and structure, other site: Secondary | ICD-10-CM | POA: Diagnosis not present

## 2020-10-06 DIAGNOSIS — Z0000 Encounter for general adult medical examination without abnormal findings: Secondary | ICD-10-CM | POA: Diagnosis not present

## 2020-10-06 DIAGNOSIS — M25512 Pain in left shoulder: Secondary | ICD-10-CM | POA: Diagnosis not present

## 2020-10-06 DIAGNOSIS — C50911 Malignant neoplasm of unspecified site of right female breast: Secondary | ICD-10-CM | POA: Diagnosis not present

## 2020-10-06 DIAGNOSIS — E7800 Pure hypercholesterolemia, unspecified: Secondary | ICD-10-CM | POA: Diagnosis not present

## 2020-10-06 DIAGNOSIS — I10 Essential (primary) hypertension: Secondary | ICD-10-CM | POA: Diagnosis not present

## 2020-10-12 ENCOUNTER — Other Ambulatory Visit: Payer: Self-pay | Admitting: Family Medicine

## 2021-01-13 DIAGNOSIS — E7800 Pure hypercholesterolemia, unspecified: Secondary | ICD-10-CM | POA: Diagnosis not present

## 2021-02-03 NOTE — Progress Notes (Deleted)
[facility]   Telephone:([phone] Fax:(336) [phone]   Clinic Follow up Note   Patient Care Team: Maury Dus, MD as PCP - General (Family Medicine) Arloa Koh, MD (Inactive) as Consulting Physician (Radiation Oncology) Marcy Panning, MD as Consulting Physician (Hematology and Oncology) 02/03/2021  CHIEF COMPLAINT: Follow-up right breast cancer  CURRENT THERAPY: Adjuvant Arimidex starting 12/20/2012  INTERVAL HISTORY: Ms. Peden returns for follow-up as scheduled.  Last seen by Dr. Burr Medico 02/06/2020.  Screening breast MRI 09/05/2020 showed 3.8 cm linear non-mass enhancement at the 6 o'clock position right breast, biopsy on 09/10/2020 Path showed fibrocystic changes, no malignancy.   REVIEW OF SYSTEMS:   Constitutional: Denies fevers, chills or abnormal weight loss Eyes: Denies blurriness of vision Ears, nose, mouth, throat, and face: Denies mucositis or sore throat Respiratory: Denies cough, dyspnea or wheezes Cardiovascular: Denies palpitation, chest discomfort or lower extremity swelling Gastrointestinal:  Denies nausea, heartburn or change in bowel habits Skin: Denies abnormal skin rashes Lymphatics: Denies new lymphadenopathy or easy bruising Neurological:Denies numbness, tingling or new weaknesses Behavioral/Psych: Mood is stable, no new changes  All other systems were reviewed with the patient and are negative.  MEDICAL HISTORY:  Past Medical History:  Diagnosis Date   Breast cancer ([place])    right 2014   GERD (gastroesophageal reflux disease)    Hepatitis C    Hypertension    Status post radiation therapy 10/01/12-10/31/12   rt breast/4250cGy 17 sessions/ rt breat boost=750cGy /3 sessions   TB (pulmonary tuberculosis)     SURGICAL HISTORY: Past Surgical History:  Procedure Laterality Date   BREAST LUMPECTOMY Right 2014   BREAST LUMPECTOMY WITH NEEDLE LOCALIZATION AND AXILLARY SENTINEL LYMPH NODE BX Right 08/20/2012   Procedure: BREAST LUMPECTOMY  WITH NEEDLE LOCALIZATION AND AXILLARY SENTINEL LYMPH NODE BX;  Surgeon: Shann Medal, MD;  Location: [facility];  Service: General;  Laterality: Right;   BREAST SURGERY     MANDIBLE FRACTURE SURGERY     THYROID CYST EXCISION     TUBAL LIGATION      I have reviewed the social history and family history with the patient and they are unchanged from previous note.  ALLERGIES:  is allergic to procardia [nifedipine].  MEDICATIONS:  Current Outpatient Medications  Medication Sig Dispense Refill   ALPRAZolam (XANAX) 0.25 MG tablet Take 1 tablet (0.25 mg total) by mouth at bedtime as needed for anxiety. 15 tablet 0   amLODipine (NORVASC) 10 MG tablet Take 10 mg by mouth daily.     anastrozole (ARIMIDEX) 1 MG tablet TAKE 1 TABLET BY MOUTH EVERY DAY 90 tablet 3   Ascorbic Acid (VITAMIN C) 100 MG tablet Take 100 mg by mouth daily.     cholecalciferol (VITAMIN D3) 25 MCG (1000 UT) tablet Take 1,000 Units by mouth daily.     fish oil-omega-3 fatty acids 1000 MG capsule Take 1 g by mouth daily.     losartan-hydrochlorothiazide (HYZAAR) 100-25 MG per tablet Take 1 tablet by mouth daily.     Multiple Vitamin (MULTIVITAMIN WITH MINERALS) TABS Take 1 tablet by mouth daily.     zinc gluconate 50 MG tablet Take 50 mg by mouth daily.     No current facility-administered medications for this visit.    PHYSICAL EXAMINATION: ECOG PERFORMANCE STATUS: {CHL ONC ECOG PS:[id number]}  There were no vitals filed for this visit. There were no vitals filed for this visit.  GENERAL:alert, no distress and comfortable SKIN: skin color, texture, turgor are normal, no rashes or  significant lesions EYES: normal, Conjunctiva are pink and non-injected, sclera clear OROPHARYNX:no exudate, no erythema and lips, buccal mucosa, and tongue normal  NECK: supple, thyroid normal size, non-tender, without nodularity LYMPH:  no palpable lymphadenopathy in the cervical, axillary or inguinal LUNGS: clear to auscultation and  percussion with normal breathing effort HEART: regular rate & rhythm and no murmurs and no lower extremity edema ABDOMEN:abdomen soft, non-tender and normal bowel sounds Musculoskeletal:no cyanosis of digits and no clubbing  NEURO: alert & oriented x 3 with fluent speech, no focal motor/sensory deficits  LABORATORY DATA:  I have reviewed the data as listed CBC Latest Ref Rng & Units 02/06/2020 02/06/2019 01/25/2018  WBC 4.0 - 10.5 K/uL 6.4 6.7 6.1  Hemoglobin 12.0 - 15.0 g/dL 14.0 13.8 13.5  Hematocrit 36.0 - 46.0 % 42.2 41.9 41.1  Platelets 150 - 400 K/uL 151 140(L) 139(L)     CMP Latest Ref Rng & Units 02/06/2020 02/06/2019 01/25/2018  Glucose 70 - 99 mg/dL 86 91 89  BUN 8 - 23 mg/dL 16 24(H) 18  Creatinine 0.44 - 1.00 mg/dL 0.76 1.11(H) 0.77  Sodium 135 - 145 mmol/L 141 140 142  Potassium 3.5 - 5.1 mmol/L 3.8 3.5 3.7  Chloride 98 - 111 mmol/L 107 102 106  CO2 22 - 32 mmol/L 26 25 27   Calcium 8.9 - 10.3 mg/dL 10.1 10.7(H) 10.3  Total Protein 6.5 - 8.1 g/dL 7.5 8.0 7.9  Total Bilirubin 0.3 - 1.2 mg/dL 0.5 1.0 0.7  Alkaline Phos 38 - 126 U/L 77 93 95  AST 15 - 41 U/L 19 22 17   ALT 0 - 44 U/L 19 25 15       RADIOGRAPHIC STUDIES: I have personally reviewed the radiological images as listed and agreed with the findings in the report. No results found.   ASSESSMENT & PLAN:  No problem-specific Assessment & Plan notes found for this encounter.   No orders of the defined types were placed in this encounter.  All questions were answered. The patient knows to call the clinic with any problems, questions or concerns. No barriers to learning was detected. I spent {CHL ONC TIME VISIT - LGXQJ:[id number]} counseling the patient face to face. The total time spent in the appointment was {CHL ONC TIME VISIT - GYJEH:[id number]} and more than 50% was on counseling and review of test results     Alla Feeling, NP 02/03/21

## 2021-02-07 ENCOUNTER — Other Ambulatory Visit: Payer: Medicare Other

## 2021-02-07 ENCOUNTER — Ambulatory Visit: Payer: Medicare Other | Admitting: Nurse Practitioner

## 2021-02-07 NOTE — Progress Notes (Signed)
[facility]   Telephone:(336) [phone] Fax:(336) [phone]   Clinic Follow up Note   Patient Care Team: Maury Dus, MD as PCP - General (Family Medicine) Arloa Koh, MD (Inactive) as Consulting Physician (Radiation Oncology) Marcy Panning, MD as Consulting Physician (Hematology and Oncology) 02/08/2021  CHIEF COMPLAINT: Follow up right breast cancer   CURRENT THERAPY: Completed 7 years of AI 01/2020, surveillance  INTERVAL HISTORY: Sharon Nguyen returns for follow up as scheduled. She was last seen 02/06/20.  DEXA 05/25/2020 showed osteopenia at the left femur neck lowest T score -1.9, low FRAX score. Mammogram 05/25/20 was negative. Screening breast MRI 09/05/20 showed linear non mass enhancement in the inferior right breast, biopsied. Path confirmed fibrocystic changes c/w previous biopsy site, no malignancy; this was felt to be concordant.   Today she presents by herself, doing well overall.  She notes she is not sleeping well lately, waking up early around 3-4 AM, then is fatigued throughout the day.  She is exercising 2 times a week, walking, going to the park, and Pilates.  She is eating healthier, more greens.  Denies concerns in her breast such as new lump/mass, nipple discharge or inversion, or skin change.  She does notice and is more bothered by the asymmetry in her breasts.  Denies bone or joint pain, unintentional weight loss, recent infection, bleeding. She notes it is triggering to be Sharon Nguyen, also remembers that her husband died while she was undergoing her treatment for breast cancer in 2014.  She feels depressed because a few close friends have been diagnosed with cancer recently.   MEDICAL HISTORY:  Past Medical History:  Diagnosis Date   Breast cancer ([place])    right 2014   GERD (gastroesophageal reflux disease)    Hepatitis C    Hypertension    Status post radiation therapy 10/01/12-10/31/12   rt breast/4250cGy 17 sessions/ rt breat boost=750cGy /3 sessions    TB (pulmonary tuberculosis)     SURGICAL HISTORY: Past Surgical History:  Procedure Laterality Date   BREAST LUMPECTOMY Right 2014   BREAST LUMPECTOMY WITH NEEDLE LOCALIZATION AND AXILLARY SENTINEL LYMPH NODE BX Right 08/20/2012   Procedure: BREAST LUMPECTOMY WITH NEEDLE LOCALIZATION AND AXILLARY SENTINEL LYMPH NODE BX;  Surgeon: Shann Medal, MD;  Location: [facility];  Service: General;  Laterality: Right;   BREAST SURGERY     MANDIBLE FRACTURE SURGERY     THYROID CYST EXCISION     TUBAL LIGATION      I have reviewed the social history and family history with the patient and they are unchanged from previous note.  ALLERGIES:  is allergic to procardia [nifedipine].  MEDICATIONS:  Current Outpatient Medications  Medication Sig Dispense Refill   ALPRAZolam (XANAX) 0.25 MG tablet Take 1 tablet (0.25 mg total) by mouth at bedtime as needed for anxiety. 15 tablet 0   amLODipine (NORVASC) 10 MG tablet Take 10 mg by mouth daily.     Ascorbic Acid (VITAMIN C) 100 MG tablet Take 100 mg by mouth daily.     cholecalciferol (VITAMIN D3) 25 MCG (1000 UT) tablet Take 1,000 Units by mouth daily.     fish oil-omega-3 fatty acids 1000 MG capsule Take 1 g by mouth daily.     losartan-hydrochlorothiazide (HYZAAR) 100-25 MG per tablet Take 1 tablet by mouth daily.     Multiple Vitamin (MULTIVITAMIN WITH MINERALS) TABS Take 1 tablet by mouth daily.     zinc gluconate 50 MG tablet Take 50 mg by mouth daily.  No current facility-administered medications for this visit.    PHYSICAL EXAMINATION: ECOG PERFORMANCE STATUS: 0 - Asymptomatic  Vitals:   02/08/21 1038  BP: (!) 133/92  Pulse: 80  Resp: 15  Temp: 97.7 F (36.5 C)  SpO2: 100%   Filed Weights   02/08/21 1038  Weight: 163 lb 3.2 oz (74 kg)    GENERAL:alert, no distress and comfortable SKIN: No rash EYES: sclera clear NECK: Without mass LYMPH:  no palpable cervical or supraclavicular lymphadenopathy LUNGS: clear with normal  breathing effort HEART: regular rate & rhythm and no murmurs and no lower extremity edema ABDOMEN:abdomen soft, non-tender and normal bowel sounds Musculoskeletal:no cyanosis of digits and no clubbing  NEURO: alert & oriented x 3 with fluent speech, no focal motor/sensory deficits Breast exam: Right breast smaller than left without bilateral nipple discharge or inversion.  S/p right lumpectomy, incisions completely healed.  Mild retraction at the lower breast at the chest wall.  No palpable mass or nodularity in either breast or axilla that I could appreciate.  LABORATORY DATA:  I have reviewed the data as listed CBC Latest Ref Rng & Units 02/08/2021 02/06/2020 02/06/2019  WBC 4.0 - 10.5 K/uL 6.1 6.4 6.7  Hemoglobin 12.0 - 15.0 g/dL 14.5 14.0 13.8  Hematocrit 36.0 - 46.0 % 41.9 42.2 41.9  Platelets 150 - 400 K/uL 162 151 140(L)     CMP Latest Ref Rng & Units 02/08/2021 02/06/2020 02/06/2019  Glucose 70 - 99 mg/dL 86 86 91  BUN 8 - 23 mg/dL 18 16 24(H)  Creatinine 0.44 - 1.00 mg/dL 0.86 0.76 1.11(H)  Sodium 135 - 145 mmol/L 142 141 140  Potassium 3.5 - 5.1 mmol/L 3.7 3.8 3.5  Chloride 98 - 111 mmol/L 106 107 102  CO2 22 - 32 mmol/L _0 Calcium 8.9 - 10.3 mg/dL 10.2 10.1 10.7(H)  Total Protein 6.5 - 8.1 g/dL 7.8 7.5 8.0  Total Bilirubin 0.3 - 1.2 mg/dL 0.7 0.5 1.0  Alkaline Phos 38 - 126 U/L 60 77 93  AST 15 - 41 U/L _1 ALT 0 - 44 U/L _2 RADIOGRAPHIC STUDIES: I have personally reviewed the radiological images as listed and agreed with the findings in the report. No results found.   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  Oncotype 13, low risk -S/p right lumpectomy on 08/20/2012, adjuvant radiation, and adjuvant antiestrogen therapy with anastrozole from 12/11/2012 - 02/11/2020 -Surveillance with mammograms in February and MRIs in June  annually   2.  Coping, depression -Patient reports early morning awakening and fatigue, admits to mild depression -Recently she has close friends who are diagnosed with cancer -Her husband died from cholangiocarcinoma in 2014 just before patient's lumpectomy -Follow-ups here are triggering.  Despite this she prefers to continue follow-up in our clinic -We discussed mental health and referral to social worker with mental health background, patient agrees  3. Osteopenia -12/2013 DEXA showed osteopenia with lowest T score -1.7.  This worsened on 02/2018 DEXA, T score -2.0 -DEXA 05/2020 improved, continue calcium and vitamin D  4.  Age-appropriate health maintenance -Encouraged her to continue healthy active lifestyle, abstaining from tobacco, limiting alcohol, and staying up-to-date on age-appropriate cancer screenings  Disposition: Ms. Waugh is clinically doing well.  Breast exam is benign, CBC and CMP are unremarkable.  I reviewed her DEXA, mammogram, and breast  MRI from 2022.  Overall there is no clinical concern for breast cancer recurrence.  She is 8 years from her initial diagnosis, the recurrence risk is significantly decreased.  Continue surveillance.  Next screening breast MRI 02/2021 and mammogram in 05/2021.  She would like a referral to plastic surgeon to discuss breast asymmetry, I referred her to Dr. Tedra Coupe him or Dr. Iran Planas per her request for female surgeon.  We discussed mental health.  I believe her fatigue and early morning awakening are symptoms of depression.  Patient is triggered by recent cancer diagnoses among several friends and the death of her husband which occurred well the patient herself was going through treatment.  I recommend to start with referral to social worker Edwyna Shell to discuss coping strategies, patient agrees and I referred her.  Ms. Halliwell prefers to continue follow-up in our clinic, I will see her back in 1 year, or sooner if needed.   Orders  Placed This Encounter  Procedures   MR BREAST BILATERAL W WO CONTRAST INC CAD    Compare 08/2020 and previous    Standing Status:   Future    Standing Expiration Date:   02/08/2022    Order Specific Question:   If indicated for the ordered procedure, I authorize the administration of contrast media per Radiology protocol    Answer:   Yes    Order Specific Question:   What is the patient's sedation requirement?    Answer:   No Sedation    Order Specific Question:   Does the patient have a pacemaker or implanted devices?    Answer:   No    Order Specific Question:   Preferred imaging location?    Answer:   [facility] (table limit - 550 lbs)   MM 3D SCREEN BREAST BILATERAL    Standing Status:   Future    Standing Expiration Date:   02/08/2022    Order Specific Question:   Reason for Exam (SYMPTOM  OR DIAGNOSIS REQUIRED)    Answer:   h/o R breast cancer 2014    Order Specific Question:   Preferred imaging location?    Answer:   [facility]   Ambulatory referral to Social Work    Referral Priority:   Urgent    Referral Type:   Consultation    Referral Reason:   Specialty Services Required    Number of Visits Requested:   1   Ambulatory referral to Plastic Surgery    Referral Priority:   Routine    Referral Type:   Surgical    Referral Reason:   Specialty Services Required    Requested Specialty:   Plastic Surgery    Number of Visits Requested:   1     All questions were answered. The patient knows to call the clinic with any problems, questions or concerns. No barriers to learning were detected.  Total encounter time is 30 minutes.     Alla Feeling, NP 02/08/21

## 2021-02-08 ENCOUNTER — Encounter: Payer: Self-pay | Admitting: Nurse Practitioner

## 2021-02-08 ENCOUNTER — Inpatient Hospital Stay: Payer: Medicare Other

## 2021-02-08 ENCOUNTER — Inpatient Hospital Stay: Payer: Medicare Other | Admitting: Nurse Practitioner

## 2021-02-08 ENCOUNTER — Inpatient Hospital Stay: Payer: Medicare Other | Attending: Nurse Practitioner

## 2021-02-08 ENCOUNTER — Other Ambulatory Visit: Payer: Self-pay

## 2021-02-08 VITALS — BP 133/92 | HR 80 | Temp 97.7°F | Resp 15 | Wt 163.2 lb

## 2021-02-08 DIAGNOSIS — C50511 Malignant neoplasm of lower-outer quadrant of right female breast: Secondary | ICD-10-CM

## 2021-02-08 DIAGNOSIS — Z923 Personal history of irradiation: Secondary | ICD-10-CM | POA: Insufficient documentation

## 2021-02-08 DIAGNOSIS — F32A Depression, unspecified: Secondary | ICD-10-CM | POA: Diagnosis not present

## 2021-02-08 DIAGNOSIS — Z853 Personal history of malignant neoplasm of breast: Secondary | ICD-10-CM | POA: Insufficient documentation

## 2021-02-08 DIAGNOSIS — Z170 Estrogen receptor positive status [ER+]: Secondary | ICD-10-CM

## 2021-02-08 DIAGNOSIS — Z1231 Encounter for screening mammogram for malignant neoplasm of breast: Secondary | ICD-10-CM | POA: Diagnosis not present

## 2021-02-08 DIAGNOSIS — Z23 Encounter for immunization: Secondary | ICD-10-CM

## 2021-02-08 DIAGNOSIS — M85852 Other specified disorders of bone density and structure, left thigh: Secondary | ICD-10-CM | POA: Insufficient documentation

## 2021-02-08 DIAGNOSIS — E559 Vitamin D deficiency, unspecified: Secondary | ICD-10-CM

## 2021-02-08 LAB — CBC WITH DIFFERENTIAL/PLATELET
Abs Immature Granulocytes: 0.02 10*3/uL (ref 0.00–0.07)
Basophils Absolute: 0.1 10*3/uL (ref 0.0–0.1)
Basophils Relative: 1 %
Eosinophils Absolute: 0.2 10*3/uL (ref 0.0–0.5)
Eosinophils Relative: 4 %
HCT: 41.9 % (ref 36.0–46.0)
Hemoglobin: 14.5 g/dL (ref 12.0–15.0)
Immature Granulocytes: 0 %
Lymphocytes Relative: 27 %
Lymphs Abs: 1.6 10*3/uL (ref 0.7–4.0)
MCH: 30.9 pg (ref 26.0–34.0)
MCHC: 34.6 g/dL (ref 30.0–36.0)
MCV: 89.1 fL (ref 80.0–100.0)
Monocytes Absolute: 0.5 10*3/uL (ref 0.1–1.0)
Monocytes Relative: 8 %
Neutro Abs: 3.7 10*3/uL (ref 1.7–7.7)
Neutrophils Relative %: 60 %
Platelets: 162 10*3/uL (ref 150–400)
RBC: 4.7 MIL/uL (ref 3.87–5.11)
RDW: 13.4 % (ref 11.5–15.5)
WBC: 6.1 10*3/uL (ref 4.0–10.5)
nRBC: 0 % (ref 0.0–0.2)

## 2021-02-08 LAB — COMPREHENSIVE METABOLIC PANEL
ALT: 14 U/L (ref 0–44)
AST: 16 U/L (ref 15–41)
Albumin: 4.4 g/dL (ref 3.5–5.0)
Alkaline Phosphatase: 60 U/L (ref 38–126)
Anion gap: 11 (ref 5–15)
BUN: 18 mg/dL (ref 8–23)
CO2: 25 mmol/L (ref 22–32)
Calcium: 10.2 mg/dL (ref 8.9–10.3)
Chloride: 106 mmol/L (ref 98–111)
Creatinine, Ser: 0.86 mg/dL (ref 0.44–1.00)
GFR, Estimated: >60 mL/min (ref >60)
Glucose, Bld: 86 mg/dL (ref 70–99)
Potassium: 3.7 mmol/L (ref 3.5–5.1)
Sodium: 142 mmol/L (ref 135–145)
Total Bilirubin: 0.7 mg/dL (ref 0.3–1.2)
Total Protein: 7.8 g/dL (ref 6.5–8.1)

## 2021-02-08 LAB — VITAMIN D 25 HYDROXY (VIT D DEFICIENCY, FRACTURES): Vit D, 25-Hydroxy: 28.62 ng/mL — ABNORMAL LOW (ref 30–100)

## 2021-02-08 MED ORDER — INFLUENZA VAC A&B SA ADJ QUAD 0.5 ML IM PRSY
0.5000 mL | PREFILLED_SYRINGE | Freq: Once | INTRAMUSCULAR | Status: AC
  Administered 2021-02-08: 0.5 mL via INTRAMUSCULAR
  Filled 2021-02-08: qty 0.5

## 2021-02-10 ENCOUNTER — Telehealth: Payer: Self-pay | Admitting: General Practice

## 2021-02-10 ENCOUNTER — Encounter: Payer: Self-pay | Admitting: General Practice

## 2021-02-10 NOTE — Telephone Encounter (Signed)
[facility] CSW Progress Notes  Call to patient per urgent referral from Lanora Manis NP.  Patient requests call back in two hours.  Edwyna Shell, LCSW Clinical Social Worker Phone:  [phone]

## 2021-02-10 NOTE — Progress Notes (Signed)
[facility] CSW Progress Notes  Call to patient per referral from Lanora Manis NP for depression.  Spoke w patient "I have friends who have all be diagnosed w cancer in the last two months."  Otherwise "things in my life are going well, how am I so deserving to have things go well for me and my friends are not."  Puts her in a low mood.  Rates mood as a 6 out of 10.  Husband died 28 years ago from cancer.  Children have all left the house, she is alone.    She often struggles with somewhat "down" mood, tries to deal with her emotions through her "strong will."  Worried/fearful that she may get drawn into a state of depression.  Wants help finding her way adjusting to life in retirement, dealing with losses in her life, adjusting to widowhood and similar.  CSW provided contacts for two local therapists who are skilled in these areas, patient will contact:  Burnetta Sabin LCSW - [phone] Jasmine Awe LCSW - [phone]  As she is also struggling with sleep, suggested she contact her PCP for help with this issue.  CSW will follow up by phone in two weeks to check in.  Edwyna Shell, LCSW Clinical Social Worker Phone:  [phone]

## 2021-02-24 ENCOUNTER — Inpatient Hospital Stay: Payer: Medicare Other | Attending: Nurse Practitioner | Admitting: General Practice

## 2021-02-24 DIAGNOSIS — C50511 Malignant neoplasm of lower-outer quadrant of right female breast: Secondary | ICD-10-CM

## 2021-02-24 NOTE — Progress Notes (Signed)
[facility] CSW Progress Notes  Call to patient to check in, no answer, left VM w my contact information and encouragement to return the call at her convenience.  Edwyna Shell, LCSW Clinical Social Worker Phone:  [phone]

## 2021-03-14 ENCOUNTER — Ambulatory Visit (HOSPITAL_COMMUNITY)
Admission: RE | Admit: 2021-03-14 | Discharge: 2021-03-14 | Disposition: A | Discharge status: Home / Self Care | Payer: Medicare Other | Source: Ambulatory Visit | Attending: Nurse Practitioner | Admitting: Nurse Practitioner

## 2021-03-14 ENCOUNTER — Telehealth: Payer: Medicare Other | Admitting: Nurse Practitioner

## 2021-03-14 DIAGNOSIS — Z170 Estrogen receptor positive status [ER+]: Secondary | ICD-10-CM | POA: Insufficient documentation

## 2021-03-14 DIAGNOSIS — C50511 Malignant neoplasm of lower-outer quadrant of right female breast: Secondary | ICD-10-CM | POA: Insufficient documentation

## 2021-03-14 DIAGNOSIS — I10 Essential (primary) hypertension: Secondary | ICD-10-CM

## 2021-03-14 DIAGNOSIS — Z1239 Encounter for other screening for malignant neoplasm of breast: Secondary | ICD-10-CM | POA: Diagnosis not present

## 2021-03-14 MED ORDER — GADOBUTROL 1 MMOL/ML IV SOLN
8.0000 mL | Freq: Once | INTRAVENOUS | Status: AC | PRN
  Administered 2021-03-14: 8 mL via INTRAVENOUS

## 2021-03-14 NOTE — Progress Notes (Signed)
Based on what you shared with me it looks like you have had hypertension ,that should be evaluated in a face to face office visit. If blood pressure continues to go up and down you will need to see your PCP.  I am charting this way so you will not be billed for this visit since you are just giving [place] information.  Just to let you know, this evisit platform doe snot go to your regular doctors office, if that was what you intended it to do.  NOTE: There will be NO CHARGE for this eVisit   If you are having a true medical emergency please call 911.      For an urgent face to face visit, [facility] has six urgent care centers for your convenience:     [facility] [place] Get Driving Directions [phone] [address] [place], [place] [address]    [facility]) Get Driving Directions [phone] [place], [place] [address]  [facility] [place] ([place]) Get Driving Directions [phone] [address] [place],  [place]  [address]  [facility] Get Driving Directions [phone] [address] [place], [place], [place] [address]   [facility] Get Driving Directions  [phone] [address], [place] [address]   [facility] [place] Get Driving Directions [phone] [address] [place], [place] [address]  Your MyChart E-visit questionnaire answers were reviewed by a board certified advanced clinical practitioner to complete your personal care plan based on your specific symptoms.  Thank you for using e-Visits.

## 2021-05-11 DIAGNOSIS — M542 Cervicalgia: Secondary | ICD-10-CM | POA: Diagnosis not present

## 2021-05-11 DIAGNOSIS — G44209 Tension-type headache, unspecified, not intractable: Secondary | ICD-10-CM | POA: Diagnosis not present

## 2021-05-18 ENCOUNTER — Telehealth: Payer: Medicare Other | Admitting: Physician Assistant

## 2021-05-18 NOTE — Progress Notes (Signed)
The patient no-showed for appointment despite this provider sending direct link, reaching out via phone with no response and waiting for at least 10 minutes from appointment time for patient to join. They will be marked as a NS for this appointment/time.  ? ?Franke Menter Cody Estel Tonelli, PA-C ? ? ? ?

## 2021-07-09 ENCOUNTER — Other Ambulatory Visit: Payer: Self-pay | Admitting: Hematology

## 2021-07-09 DIAGNOSIS — E559 Vitamin D deficiency, unspecified: Secondary | ICD-10-CM

## 2021-07-11 ENCOUNTER — Other Ambulatory Visit: Payer: Self-pay | Admitting: Family Medicine

## 2021-07-11 ENCOUNTER — Other Ambulatory Visit: Payer: Self-pay

## 2021-07-11 ENCOUNTER — Ambulatory Visit
Admission: RE | Admit: 2021-07-11 | Discharge: 2021-07-11 | Disposition: A | Discharge status: Home / Self Care | Payer: Medicare Other | Source: Ambulatory Visit | Attending: Family Medicine | Admitting: Family Medicine

## 2021-07-11 DIAGNOSIS — C50511 Malignant neoplasm of lower-outer quadrant of right female breast: Secondary | ICD-10-CM

## 2021-07-11 DIAGNOSIS — M542 Cervicalgia: Secondary | ICD-10-CM

## 2021-07-11 DIAGNOSIS — M47812 Spondylosis without myelopathy or radiculopathy, cervical region: Secondary | ICD-10-CM | POA: Diagnosis not present

## 2021-07-20 DIAGNOSIS — M79631 Pain in right forearm: Secondary | ICD-10-CM | POA: Diagnosis not present

## 2021-07-20 DIAGNOSIS — M542 Cervicalgia: Secondary | ICD-10-CM | POA: Diagnosis not present

## 2021-07-20 DIAGNOSIS — M5412 Radiculopathy, cervical region: Secondary | ICD-10-CM | POA: Diagnosis not present

## 2021-07-25 ENCOUNTER — Other Ambulatory Visit: Payer: Self-pay | Admitting: Family Medicine

## 2021-07-25 DIAGNOSIS — M5090 Cervical disc disorder, unspecified, unspecified cervical region: Secondary | ICD-10-CM

## 2021-07-27 DIAGNOSIS — M79631 Pain in right forearm: Secondary | ICD-10-CM | POA: Diagnosis not present

## 2021-07-27 DIAGNOSIS — M542 Cervicalgia: Secondary | ICD-10-CM | POA: Diagnosis not present

## 2021-07-27 DIAGNOSIS — M5412 Radiculopathy, cervical region: Secondary | ICD-10-CM | POA: Diagnosis not present

## 2021-08-03 DIAGNOSIS — M79631 Pain in right forearm: Secondary | ICD-10-CM | POA: Diagnosis not present

## 2021-08-03 DIAGNOSIS — M5412 Radiculopathy, cervical region: Secondary | ICD-10-CM | POA: Diagnosis not present

## 2021-08-03 DIAGNOSIS — M542 Cervicalgia: Secondary | ICD-10-CM | POA: Diagnosis not present

## 2021-09-26 ENCOUNTER — Ambulatory Visit
Admission: RE | Admit: 2021-09-26 | Discharge: 2021-09-26 | Disposition: A | Discharge status: Home / Self Care | Payer: Medicare Other | Source: Ambulatory Visit | Attending: Family Medicine | Admitting: Family Medicine

## 2021-09-26 DIAGNOSIS — M5090 Cervical disc disorder, unspecified, unspecified cervical region: Secondary | ICD-10-CM

## 2021-09-26 DIAGNOSIS — M542 Cervicalgia: Secondary | ICD-10-CM | POA: Diagnosis not present

## 2021-09-26 DIAGNOSIS — M4802 Spinal stenosis, cervical region: Secondary | ICD-10-CM | POA: Diagnosis not present

## 2021-10-12 ENCOUNTER — Other Ambulatory Visit: Payer: Self-pay | Admitting: Nurse Practitioner

## 2021-10-12 DIAGNOSIS — Z1231 Encounter for screening mammogram for malignant neoplasm of breast: Secondary | ICD-10-CM

## 2021-10-21 ENCOUNTER — Encounter: Payer: Self-pay | Admitting: Physical Medicine & Rehabilitation

## 2021-10-31 ENCOUNTER — Ambulatory Visit: Payer: Medicare Other

## 2021-10-31 ENCOUNTER — Ambulatory Visit
Admission: RE | Admit: 2021-10-31 | Discharge: 2021-10-31 | Disposition: A | Discharge status: Home / Self Care | Payer: Medicare Other | Source: Ambulatory Visit | Attending: Nurse Practitioner | Admitting: Nurse Practitioner

## 2021-10-31 DIAGNOSIS — Z1231 Encounter for screening mammogram for malignant neoplasm of breast: Secondary | ICD-10-CM | POA: Diagnosis not present

## 2021-10-31 DIAGNOSIS — R350 Frequency of micturition: Secondary | ICD-10-CM | POA: Diagnosis not present

## 2021-10-31 DIAGNOSIS — R3915 Urgency of urination: Secondary | ICD-10-CM | POA: Diagnosis not present

## 2021-10-31 DIAGNOSIS — N390 Urinary tract infection, site not specified: Secondary | ICD-10-CM | POA: Diagnosis not present

## 2021-11-24 ENCOUNTER — Encounter: Payer: Medicare Other | Admitting: Physical Medicine & Rehabilitation

## 2021-12-07 DIAGNOSIS — E7800 Pure hypercholesterolemia, unspecified: Secondary | ICD-10-CM | POA: Diagnosis not present

## 2021-12-07 DIAGNOSIS — I10 Essential (primary) hypertension: Secondary | ICD-10-CM | POA: Diagnosis not present

## 2021-12-07 DIAGNOSIS — M8588 Other specified disorders of bone density and structure, other site: Secondary | ICD-10-CM | POA: Diagnosis not present

## 2021-12-07 DIAGNOSIS — Z0000 Encounter for general adult medical examination without abnormal findings: Secondary | ICD-10-CM | POA: Diagnosis not present

## 2021-12-07 DIAGNOSIS — Z87898 Personal history of other specified conditions: Secondary | ICD-10-CM | POA: Diagnosis not present

## 2021-12-07 DIAGNOSIS — E049 Nontoxic goiter, unspecified: Secondary | ICD-10-CM | POA: Diagnosis not present

## 2021-12-12 ENCOUNTER — Encounter: Payer: Medicare Other | Admitting: Physical Medicine & Rehabilitation

## 2022-01-16 ENCOUNTER — Encounter: Payer: Self-pay | Admitting: Physical Medicine & Rehabilitation

## 2022-01-16 ENCOUNTER — Encounter: Payer: Medicare Other | Attending: Physical Medicine & Rehabilitation | Admitting: Physical Medicine & Rehabilitation

## 2022-01-16 VITALS — BP 124/83 | HR 86 | Ht 67.5 in | Wt 159.0 lb

## 2022-01-16 DIAGNOSIS — M542 Cervicalgia: Secondary | ICD-10-CM | POA: Insufficient documentation

## 2022-01-16 DIAGNOSIS — M25519 Pain in unspecified shoulder: Secondary | ICD-10-CM | POA: Insufficient documentation

## 2022-01-16 DIAGNOSIS — M47812 Spondylosis without myelopathy or radiculopathy, cervical region: Secondary | ICD-10-CM | POA: Diagnosis not present

## 2022-01-16 DIAGNOSIS — F39 Unspecified mood [affective] disorder: Secondary | ICD-10-CM | POA: Insufficient documentation

## 2022-01-16 DIAGNOSIS — M19012 Primary osteoarthritis, left shoulder: Secondary | ICD-10-CM | POA: Insufficient documentation

## 2022-01-16 MED ORDER — DICLOFENAC SODIUM 1 % EX GEL: 2.0000 g | Freq: Four times a day (QID) | CUTANEOUS | Status: AC

## 2022-01-16 MED ORDER — DULOXETINE HCL 30 MG PO CPEP
30.0000 mg | ORAL_CAPSULE | Freq: Every day | ORAL | 3 refills | Status: DC
Start: 2022-01-16 — End: 2022-02-13

## 2022-01-16 NOTE — Progress Notes (Unsigned)
Subjective:    Patient ID: Sharon Nguyen, female    DOB: January 14, 1956, 66 y.o.   MRN: [id number]  HPI Ms. Emrich is a 66 year old female with past medical history of breast cancer status post lumpectomy and radiation, essential tremor, hypertension, hep C who is here for chronic pain in her neck, left shoulder and arm.  Patient reports that the pain she is experiencing is primarily burning in quality. She tried PT and was provided exercises earlier this year that helped. She doesn't always keep up with the excices.  Patient reports that the pain will worsen with changes in weather.  Is she has used Avexia cream with improvement of her pain and it appears this has a combination of THC and CBD.  She also use IcyHot with mild benefit to her pain.  She will occasionally take Tylenol ibuprofen with reduction of her back and arm pain.  Patient reports her mood is okay overall but sometimes she does feel down.  She uses melatonin to try to get more sleep at night and does not always sleep throughout the night.    Pain Inventory Average Pain 8 Pain Right Now 10 My pain is burning and tingling  In the last 24 hours, has pain interfered with the following? General activity 5 Relation with others 0 Enjoyment of life 5 What TIME of day is your pain at its worst? morning  Sleep (in general) NA  Pain is worse with: sitting and inactivity Pain improves with: rest, therapy/exercise, and medication Relief from Meds: 7  walk without assistance how many minutes can you walk? 2 hours ability to climb steps?  yes do you drive?  yes  not employed: date last employed 10/07/2018  bladder control problems tremor tingling  Any changes since last visit?  no  Any changes since last visit?  no    Family History  Problem Relation Age of Onset   Mesothelioma Father        died age 67   Cancer Father 84        colon cancer, d. 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   Has 4 sons 2 in [place], 1 in [place] and 1 in [place] Strain: Low Risk  (02/10/2021)   Overall Financial Resource Strain (CARDIA)    Difficulty of Paying Living Expenses: Not hard at all  Food Insecurity: No Food Insecurity (02/10/2021)   Hunger Vital Sign    Worried  About Running Out of Food in the Last Year: Never true    Ran Out of Food in the Last Year: Never true  Transportation Needs: No Transportation Needs (02/10/2021)   PRAPARE - Hydrologist (Medical): No    Lack of Transportation (Non-Medical): No  Physical Activity: Not on file  Stress: Not on file  Social Connections: Socially Isolated (02/10/2021)   Social Connection and Isolation Panel [NHANES]    Frequency of Communication with Friends and Family: Twice a week    Frequency of Social Gatherings with Friends and Family: Once a week    Attends Religious Services: Never    Marine scientist or Organizations: No    Attends Archivist Meetings: Never    Marital Status: Widowed   Past Surgical History:  Procedure Laterality Date   BREAST LUMPECTOMY Right 2014   BREAST LUMPECTOMY WITH NEEDLE  LOCALIZATION AND AXILLARY SENTINEL LYMPH NODE BX Right 08/20/2012   Procedure: BREAST LUMPECTOMY WITH NEEDLE LOCALIZATION AND AXILLARY SENTINEL LYMPH NODE BX;  Surgeon: Shann Medal, MD;  Location: [facility];  Service: General;  Laterality: Right;   BREAST SURGERY     MANDIBLE FRACTURE SURGERY     THYROID CYST EXCISION     TUBAL LIGATION     Past Medical History:  Diagnosis Date   Breast cancer ([place])    right 2014   GERD (gastroesophageal reflux disease)    Hepatitis C    Hypertension    Status post radiation therapy 10/01/12-10/31/12   rt breast/4250cGy 17 sessions/ rt breat boost=750cGy /3 sessions   TB (pulmonary tuberculosis)    BP 124/83   Pulse 86   Ht 5' 7.5" (1.715 m)   Wt 159 lb (72.1 kg)   SpO2 98%   BMI 24.54 kg/m   Opioid Risk Score:   Fall Risk Score:  `1  Depression screen PHQ 2/9     01/16/2022    1:40 PM 02/10/2021    2:56 PM  Depression screen PHQ 2/9  Decreased Interest 1   Down, Depressed, Hopeless 1 2  PHQ - 2 Score 2 2  Altered sleeping 1 3  Tired, decreased energy 1 2  Change in appetite 0 0  Feeling bad or failure about yourself  0 1  Trouble concentrating 1 2  Moving slowly or fidgety/restless 0 0  Suicidal thoughts 0 0  PHQ-9 Score 5 10  Difficult doing work/chores Not difficult at all Somewhat difficult      Review of Systems  Musculoskeletal:  Positive for back pain.       Bilateral Shoulder pain Back pain   All other systems reviewed and are negative.     Objective:   Physical Exam  Gen: no distress, normal appearing HEENT: oral mucosa pink and moist, NCAT Cardio: Reg rate Chest: normal effort, normal rate of breathing Abd: soft, non-distended Ext: no edema Psych: pleasant, normal affect Skin: intact Neuro: Alert and oriented x4, follows commands, cranial nerves II through XII grossly intact, normal insight and judgment Strength 5 out of 5 to bilateral shoulder abduction, elbow flexion extension, finger flexion, hip flexion,  knee extension, ankle PF and DF Sensation intact light touch in all 4 extremities DTR normal and symmetric Musculoskeletal: Tenderness noted over proximal arm, anterior and posterior shoulder, AC joint on the left, no tenderness on the right Minimal tenderness cervical paraspinal muscles Spurling's negative bilaterally Crossarm test positive on the left Pain with internal rotation of the shoulder  on the left Apprehension test negative Neer's and Hawking's test negative on the left  MRI C spine 09/26/21 FINDINGS: Alignment: Trace retrolisthesis of C4 on C5 and C6 on C7.   Vertebrae: No acute fracture or suspicious osseous lesion.   Cord: Normal signal and morphology.   Posterior Fossa, vertebral arteries, paraspinal tissues: Negative.   Disc levels:   C2-C3: Small central disc protrusion. No spinal canal stenosis or neural foraminal narrowing.   C3-C4: Disc height loss with mild disc bulge. Uncovertebral and facet arthropathy. Mild spinal canal stenosis. Mild-to-moderate left and moderate right neural foraminal narrowing.   C4-C5: Trace retrolisthesis. Disc height loss with disc bulge. Uncovertebral and facet arthropathy. Mild spinal canal stenosis. Severe bilateral neural foraminal narrowing.   C5-C6: Disc height loss and disc bulge. Uncovertebral and facet arthropathy. Moderate spinal canal stenosis. Severe bilateral neural foraminal narrowing.   C6-C7: Trace retrolisthesis. Disc height loss with disc bulge. Uncovertebral and facet arthropathy. Mild spinal canal stenosis. Severe bilateral neural foraminal narrowing.   C7-T1: Mild disc bulge. No spinal canal stenosis or neural foraminal narrowing.   IMPRESSION: 1. C5-C6 moderate spinal canal stenosis and severe bilateral neural foraminal narrowing. 2. C4-C5 and C6-C7 mild spinal canal stenosis and severe bilateral neural foraminal narrowing. 3. C3-C4 mild spinal canal stenosis, with moderate right and mild-to-moderate  left neural foraminal narrowing.   Assessment & Plan:   Neck and shoulder neuropathic pain. She has cervical spondylosis with multilevel spinal canal stenosis and bilateral foraminal narrowing that is likely the cause of his pain. -We will start duloxetine 30 mg, can increase to 60 mg daily if she tolerates this well -Discussed restarting home exercise therapy, patient reports she remembers the prior exercises taught by PT when she had therapy earlier this year  Left shoulder OA -She does have some tenderness at her [facility] joint and some pain with internal rotation, suspect she may have some arthritis in her shoulder/AC joint.  However patient reports pain replicated on physical exam is a different type of pain than the burning sensation in her shoulder that is causing her the greatest discomfort.   -Start Voltaren gel to her left shoulder -Consider left shoulder x-ray  Mood disorder -Duloxetine may be a good option as this may provide benefit to her mood in addition to her pain

## 2022-01-17 ENCOUNTER — Encounter: Payer: Self-pay | Admitting: Physical Medicine & Rehabilitation

## 2022-02-07 ENCOUNTER — Encounter: Payer: Self-pay | Admitting: Nurse Practitioner

## 2022-02-07 ENCOUNTER — Inpatient Hospital Stay (HOSPITAL_BASED_OUTPATIENT_CLINIC_OR_DEPARTMENT_OTHER): Payer: Medicare Other | Admitting: Nurse Practitioner

## 2022-02-07 ENCOUNTER — Inpatient Hospital Stay: Payer: Medicare Other

## 2022-02-07 ENCOUNTER — Other Ambulatory Visit: Payer: Self-pay

## 2022-02-07 ENCOUNTER — Inpatient Hospital Stay: Payer: Medicare Other | Attending: Nurse Practitioner | Admitting: Hematology

## 2022-02-07 VITALS — BP 129/93 | HR 84 | Temp 98.2°F | Resp 15 | Wt 158.7 lb

## 2022-02-07 DIAGNOSIS — Z853 Personal history of malignant neoplasm of breast: Secondary | ICD-10-CM | POA: Insufficient documentation

## 2022-02-07 DIAGNOSIS — N644 Mastodynia: Secondary | ICD-10-CM | POA: Diagnosis not present

## 2022-02-07 DIAGNOSIS — F32A Depression, unspecified: Secondary | ICD-10-CM | POA: Diagnosis not present

## 2022-02-07 DIAGNOSIS — Z170 Estrogen receptor positive status [ER+]: Secondary | ICD-10-CM | POA: Diagnosis not present

## 2022-02-07 DIAGNOSIS — M8580 Other specified disorders of bone density and structure, unspecified site: Secondary | ICD-10-CM | POA: Diagnosis not present

## 2022-02-07 DIAGNOSIS — Z79899 Other long term (current) drug therapy: Secondary | ICD-10-CM | POA: Diagnosis not present

## 2022-02-07 DIAGNOSIS — Z923 Personal history of irradiation: Secondary | ICD-10-CM | POA: Insufficient documentation

## 2022-02-07 DIAGNOSIS — E559 Vitamin D deficiency, unspecified: Secondary | ICD-10-CM | POA: Insufficient documentation

## 2022-02-07 DIAGNOSIS — C50511 Malignant neoplasm of lower-outer quadrant of right female breast: Secondary | ICD-10-CM

## 2022-02-07 LAB — CMP (CANCER CENTER ONLY)
ALT: 18 U/L (ref 0–44)
AST: 21 U/L (ref 15–41)
Albumin: 4.7 g/dL (ref 3.5–5.0)
Alkaline Phosphatase: 63 U/L (ref 38–126)
Anion gap: 7 (ref 5–15)
BUN: 16 mg/dL (ref 8–23)
CO2: 30 mmol/L (ref 22–32)
Calcium: 10.6 mg/dL — ABNORMAL HIGH (ref 8.9–10.3)
Chloride: 102 mmol/L (ref 98–111)
Creatinine: 0.94 mg/dL (ref 0.44–1.00)
GFR, Estimated: >60 mL/min (ref >60)
Glucose, Bld: 99 mg/dL (ref 70–99)
Potassium: 3.9 mmol/L (ref 3.5–5.1)
Sodium: 139 mmol/L (ref 135–145)
Total Bilirubin: 1 mg/dL (ref 0.3–1.2)
Total Protein: 7.9 g/dL (ref 6.5–8.1)

## 2022-02-07 LAB — CBC WITH DIFFERENTIAL (CANCER CENTER ONLY)
Abs Immature Granulocytes: 0.01 10*3/uL (ref 0.00–0.07)
Basophils Absolute: 0.1 10*3/uL (ref 0.0–0.1)
Basophils Relative: 1 %
Eosinophils Absolute: 0.2 10*3/uL (ref 0.0–0.5)
Eosinophils Relative: 3 %
HCT: 44.3 % (ref 36.0–46.0)
Hemoglobin: 15 g/dL (ref 12.0–15.0)
Immature Granulocytes: 0 %
Lymphocytes Relative: 25 %
Lymphs Abs: 1.7 10*3/uL (ref 0.7–4.0)
MCH: 30.2 pg (ref 26.0–34.0)
MCHC: 33.9 g/dL (ref 30.0–36.0)
MCV: 89.3 fL (ref 80.0–100.0)
Monocytes Absolute: 0.5 10*3/uL (ref 0.1–1.0)
Monocytes Relative: 7 %
Neutro Abs: 4.4 10*3/uL (ref 1.7–7.7)
Neutrophils Relative %: 64 %
Platelet Count: 175 10*3/uL (ref 150–400)
RBC: 4.96 MIL/uL (ref 3.87–5.11)
RDW: 13.3 % (ref 11.5–15.5)
WBC Count: 6.8 10*3/uL (ref 4.0–10.5)
nRBC: 0 % (ref 0.0–0.2)

## 2022-02-07 LAB — VITAMIN D 25 HYDROXY (VIT D DEFICIENCY, FRACTURES): Vit D, 25-Hydroxy: 23.9 ng/mL — ABNORMAL LOW (ref 30–100)

## 2022-02-07 NOTE — Progress Notes (Signed)
[place]   [phone] Fax:([phone]   Clinic Follow up Note   Patient Care Team: Maury Dus, MD as PCP - General (Family Medicine) Arloa Koh, MD (Inactive) as Consulting Physician (Radiation Oncology) Marcy Panning, MD as Consulting Physician (Hematology and Oncology) 02/07/2022  CHIEF COMPLAINT: Follow-up right breast cancer  CURRENT THERAPY: Pleated 7 years of AI 01/2020, currently on surveillance  INTERVAL HISTORY: Sharon Nguyen returns for follow-up as scheduled, last seen by me 02/08/2021.  Screening MRI 03/14/2021 was negative.  [place] mammogram 10/31/2021 was negative with breast density category C.  She is doing well overall without significant changes in her health.  Continues to have back and left arm pain, recently on Cymbalta.  About a month ago she developed pain in the left upper breast that has occurred twice.  No obvious injury, strain, or other provoking event.  Denies other lump/mass, nipple discharge or inversion, or skin change.  All other systems were reviewed with the patient and are negative.  MEDICAL HISTORY:  Past Medical History:  Diagnosis Date   Breast cancer ([place])    right 2014   GERD (gastroesophageal reflux disease)    Hepatitis C    Hypertension    Status post radiation therapy 10/01/12-10/31/12   rt breast/4250cGy 17 sessions/ rt breat boost=750cGy /3 sessions   TB (pulmonary tuberculosis)     SURGICAL HISTORY: Past Surgical History:  Procedure Laterality Date   BREAST LUMPECTOMY Right 2014   BREAST LUMPECTOMY WITH NEEDLE LOCALIZATION AND AXILLARY SENTINEL LYMPH NODE BX Right 08/20/2012   Procedure: BREAST LUMPECTOMY WITH NEEDLE LOCALIZATION AND AXILLARY SENTINEL LYMPH NODE BX;  Surgeon: Shann Medal, MD;  Location: [facility];  Service: General;  Laterality: Right;   BREAST SURGERY     MANDIBLE FRACTURE SURGERY     THYROID CYST EXCISION     TUBAL LIGATION      I have reviewed the social history and  family history with the patient and they are unchanged from previous note.  ALLERGIES:  is allergic to procardia [nifedipine].  MEDICATIONS:  Current Outpatient Medications  Medication Sig Dispense Refill   ALPRAZolam (XANAX) 0.25 MG tablet Take 1 tablet (0.25 mg total) by mouth at bedtime as needed for anxiety. 15 tablet 0   amLODipine (NORVASC) 10 MG tablet Take 10 mg by mouth daily.     Ascorbic Acid (VITAMIN C) 100 MG tablet Take 100 mg by mouth daily.     atorvastatin (LIPITOR) 20 MG tablet Take 20 mg by mouth daily.     cholecalciferol (VITAMIN D3) 25 MCG (1000 UT) tablet Take 1,000 Units by mouth daily.     DULoxetine (CYMBALTA) 30 MG capsule Take 1 capsule (30 mg total) by mouth daily. 30 capsule 3   fish oil-omega-3 fatty acids 1000 MG capsule Take 1 g by mouth daily.     losartan-hydrochlorothiazide (HYZAAR) 100-25 MG per tablet Take 1 tablet by mouth daily.     Multiple Vitamin (MULTIVITAMIN WITH MINERALS) TABS Take 1 tablet by mouth daily.     zinc gluconate 50 MG tablet Take 50 mg by mouth daily.     Current Facility-Administered Medications  Medication Dose Route Frequency Provider Last Rate Last Admin   diclofenac Sodium (VOLTAREN) 1 % topical gel 2 g  2 g Topical QID Jennye Boroughs, MD        PHYSICAL EXAMINATION: ECOG PERFORMANCE STATUS: 0 - Asymptomatic  Vitals:   02/07/22 1031  BP: (!) 129/93  Pulse: 84  Resp:  15  Temp: 98.2 F (36.8 C)  SpO2: 97%   Filed Weights   02/07/22 1031  Weight: 158 lb 11.2 oz (72 kg)    GENERAL:alert, no distress and comfortable SKIN: No rash EYES: sclera clear LYMPH:  no palpable color supraclavicular lymphadenopathy LUNGS:  normal breathing effort HEART:  no lower extremity edema ABDOMEN:abdomen soft, non-tender and normal bowel sounds NEURO: alert & oriented x 3 with fluent speech, no focal motor/sensory deficits Asked exam: Breasts are symmetrical without nipple discharge or inversion.  S/p right lumpectomy,  incisions completely healed.  Focal density with tenderness  in the left upper central breast 12:00.  No other palpable mass or nodularity in either breast or axilla that I could appreciate.  LABORATORY DATA:  I have reviewed the data as listed    Latest Ref Rng & Units 02/07/2022   11:56 AM 02/08/2021   10:20 AM 02/06/2020    7:50 AM  CBC  WBC 4.0 - 10.5 K/uL 6.8  6.1  6.4   Hemoglobin 12.0 - 15.0 g/dL 15.0  14.5  14.0   Hematocrit 36.0 - 46.0 % 44.3  41.9  42.2   Platelets 150 - 400 K/uL 175  162  151         Latest Ref Rng & Units 02/07/2022   11:56 AM 02/08/2021   10:20 AM 02/06/2020    7:50 AM  CMP  Glucose 70 - 99 mg/dL 99  86  86   BUN 8 - 23 mg/dL '16  18  16   ' Creatinine 0.44 - 1.00 mg/dL 0.94  0.86  0.76   Sodium 135 - 145 mmol/L 139  142  141   Potassium 3.5 - 5.1 mmol/L 3.9  3.7  3.8   Chloride 98 - 111 mmol/L 102  106  107   CO2 22 - 32 mmol/L '30  25  26   ' Calcium 8.9 - 10.3 mg/dL 10.6  10.2  10.1   Total Protein 6.5 - 8.1 g/dL 7.9  7.8  7.5   Total Bilirubin 0.3 - 1.2 mg/dL 1.0  0.7  0.5   Alkaline Phos 38 - 126 U/L 63  60  77   AST 15 - 41 U/L '21  16  19   ' ALT 0 - 44 U/L '18  14  19       ' RADIOGRAPHIC STUDIES: I have personally reviewed the radiological images as listed and agreed with the findings in the report. No results found.   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  Oncotype 13, low risk -S/p right lumpectomy on 08/20/2012, adjuvant radiation, and adjuvant antiestrogen therapy with anastrozole from 12/11/2012 - 02/11/2020 -Currently under surveillance and high risk screening program including annual mammogram and MRI staggered 6 months apart, last mammogram 10/2021 was negative, she has dense breast tissue category C -[name] is clinically doing well.  She has 1 month history of sporadic left breast pain, exam shows tenderness in the  soft tissue density at the 12:00 location.  This is likely benign dense breast tissue, but given her history and her concern we will proceed with diagnostic left mammo and possible ultrasound -If work-up is negative, continue surveillance.  Next MRI 04/2022 and mammogram 10/2022 -Follow-up in 1 year, or sooner if needed pending further work-up   2.  Depression -Last year she reported early morning awakening and fatigue, admits to mild depression -she  has close friends who are diagnosed with cancer -Her husband died from cholangiocarcinoma in 2014 just before patient's lumpectomy -Follow-ups here are triggering.  Despite this she prefers to continue follow-up in our clinic -She spoke with social worker Edwyna Shell last year we discussed mental health and referral to social worker with mental health background, patient agrees   3. Osteopenia -12/2013 DEXA showed osteopenia with lowest T score -1.7.  This worsened on 02/2018 DEXA, T score -2.0 -DEXA 05/2020 improved, continue calcium and vitamin D   4.  Age-appropriate health maintenance -Encouraged her to continue healthy active lifestyle, abstaining from tobacco, limiting alcohol, and staying up-to-date on age-appropriate cancer screenings  PLAN: -Labs reviewed -Diag L mammo/US at breast center in next 1-2 weeks, will schedule and call with results -If negative continue surveillance with annual f/up with annual mammo and MRI staggered 6 months apart (next MRI 04/2022 and Mammo 10/2022)    Orders Placed This Encounter  Procedures   MM DIAG BREAST TOMO UNI LEFT    Standing Status:   Future    Standing Expiration Date:   02/08/2023    Order Specific Question:   Reason for Exam (SYMPTOM  OR DIAGNOSIS REQUIRED)    Answer:   h/o R breast cancer 2014; new 1 mo h/o left breast pain. density cat C    Order Specific Question:   Preferred imaging location?    [facility]   [facility]   US BREAST LTD UNI LEFT INC AXILLA    Standing Status:    Future    Standing Expiration Date:   02/08/2023    Order Specific Question:   Reason for Exam (SYMPTOM  OR DIAGNOSIS REQUIRED)    Answer:   h/o R breast cancer 2014; new 1 mo h/o left breast pain. density cat C    Order Specific Question:   Preferred imaging location?    Answer:   [facility]   CBC with Differential ([place] Only)    Standing Status:   Future    Number of Occurrences:   1    Standing Expiration Date:   02/08/2023   CMP ([place] only)    Standing Status:   Future    Number of Occurrences:   1    Standing Expiration Date:   02/08/2023   All questions were answered. The patient knows to call the clinic with any problems, questions or concerns. No barriers to learning was detected. I spent 20 minutes counseling the patient face to face. The total time spent in the appointment was 30 minutes and more than 50% was on counseling and review of test results.     Alla Feeling, NP 02/07/22

## 2022-02-09 ENCOUNTER — Other Ambulatory Visit: Payer: Self-pay

## 2022-02-12 ENCOUNTER — Other Ambulatory Visit: Payer: Self-pay | Admitting: Physical Medicine & Rehabilitation

## 2022-02-27 ENCOUNTER — Encounter: Payer: Medicare Other | Attending: Physical Medicine & Rehabilitation | Admitting: Physical Medicine & Rehabilitation

## 2022-02-27 DIAGNOSIS — M542 Cervicalgia: Secondary | ICD-10-CM | POA: Insufficient documentation

## 2022-02-27 DIAGNOSIS — M19012 Primary osteoarthritis, left shoulder: Secondary | ICD-10-CM | POA: Insufficient documentation

## 2022-02-27 DIAGNOSIS — F39 Unspecified mood [affective] disorder: Secondary | ICD-10-CM | POA: Insufficient documentation

## 2022-02-27 DIAGNOSIS — M47812 Spondylosis without myelopathy or radiculopathy, cervical region: Secondary | ICD-10-CM | POA: Insufficient documentation

## 2022-02-27 DIAGNOSIS — M25519 Pain in unspecified shoulder: Secondary | ICD-10-CM | POA: Insufficient documentation

## 2022-03-01 ENCOUNTER — Other Ambulatory Visit: Payer: Medicare Other

## 2022-03-27 DIAGNOSIS — B3731 Acute candidiasis of vulva and vagina: Secondary | ICD-10-CM | POA: Diagnosis not present

## 2022-03-27 DIAGNOSIS — R8290 Unspecified abnormal findings in urine: Secondary | ICD-10-CM | POA: Diagnosis not present

## 2022-03-28 ENCOUNTER — Other Ambulatory Visit: Payer: Medicare Other

## 2022-04-11 ENCOUNTER — Ambulatory Visit
Admission: RE | Admit: 2022-04-11 | Discharge: 2022-04-11 | Disposition: A | Discharge status: Home / Self Care | Payer: Medicare Other | Source: Ambulatory Visit | Attending: Nurse Practitioner | Admitting: Nurse Practitioner

## 2022-04-11 DIAGNOSIS — Z853 Personal history of malignant neoplasm of breast: Secondary | ICD-10-CM | POA: Diagnosis not present

## 2022-04-11 DIAGNOSIS — Z170 Estrogen receptor positive status [ER+]: Secondary | ICD-10-CM

## 2022-04-11 DIAGNOSIS — N644 Mastodynia: Secondary | ICD-10-CM

## 2022-04-13 ENCOUNTER — Telehealth: Payer: Self-pay

## 2022-04-13 NOTE — Telephone Encounter (Addendum)
Called patient to relay message as per Cira Rue NP patient stated she wants to put the MRI on hold for now.    ----- Message from Alla Feeling, NP sent at 04/12/2022  2:09 PM EST ----- Please call pt, let her know MM/US are negative, nothing suspicious. She is coming up due for screening MRI in January. If she would like to proceed, please let me know and I will place the order.   Thanks, Regan Rakers NP

## 2022-04-13 NOTE — Telephone Encounter (Signed)
-----   Message from Alla Feeling, NP sent at 04/12/2022  2:09 PM EST ----- Please call pt, let her know MM/US are negative, nothing suspicious. She is coming up due for screening MRI in January. If she would like to proceed, please let me know and I will place the order.   Thanks, Regan Rakers NP

## 2022-07-12 ENCOUNTER — Other Ambulatory Visit: Payer: Self-pay | Admitting: Hematology

## 2022-07-12 DIAGNOSIS — C50511 Malignant neoplasm of lower-outer quadrant of right female breast: Secondary | ICD-10-CM

## 2022-10-10 ENCOUNTER — Other Ambulatory Visit: Payer: Self-pay | Admitting: Hematology

## 2022-10-10 DIAGNOSIS — Z1231 Encounter for screening mammogram for malignant neoplasm of breast: Secondary | ICD-10-CM

## 2022-11-07 ENCOUNTER — Ambulatory Visit: Payer: Medicare Other

## 2022-11-08 ENCOUNTER — Ambulatory Visit
Admission: RE | Admit: 2022-11-08 | Discharge: 2022-11-08 | Disposition: A | Discharge status: Home / Self Care | Payer: Medicare Other | Source: Ambulatory Visit | Attending: Hematology | Admitting: Hematology

## 2022-11-08 DIAGNOSIS — Z1231 Encounter for screening mammogram for malignant neoplasm of breast: Secondary | ICD-10-CM

## 2022-11-14 ENCOUNTER — Ambulatory Visit: Payer: Medicare Other

## 2022-11-22 NOTE — Progress Notes (Signed)
This encounter was created in error - please disregard.

## 2022-12-19 ENCOUNTER — Telehealth: Payer: Self-pay | Admitting: Nurse Practitioner

## 2022-12-29 ENCOUNTER — Other Ambulatory Visit: Payer: Self-pay

## 2022-12-29 DIAGNOSIS — C50511 Malignant neoplasm of lower-outer quadrant of right female breast: Secondary | ICD-10-CM

## 2022-12-31 NOTE — Progress Notes (Deleted)
Patient Care Team: Elias Else, MD (Inactive) as PCP - General (Family Medicine) Chipper Herb, MD (Inactive) as Consulting Physician (Radiation Oncology) Drue Second, MD as Consulting Physician (Hematology and Oncology)   CHIEF COMPLAINT: Follow up right breast cancer    CURRENT THERAPY: Completed 7 years of AI 01/2020, currently on surveillance   INTERVAL HISTORY Sharon Nguyen returns for follow up as scheduled, last seen by me 02/07/22.   ROS   Past Medical History:  Diagnosis Date   Breast cancer (HCC)    right 2014   GERD (gastroesophageal reflux disease)    Hepatitis C    Hypertension    Status post radiation therapy 10/01/12-10/31/12   rt breast/4250cGy 17 sessions/ rt breat boost=750cGy /3 sessions   TB (pulmonary tuberculosis)      Past Surgical History:  Procedure Laterality Date   BREAST LUMPECTOMY Right 2014   BREAST LUMPECTOMY WITH NEEDLE LOCALIZATION AND AXILLARY SENTINEL LYMPH NODE BX Right 08/20/2012   Procedure: BREAST LUMPECTOMY WITH NEEDLE LOCALIZATION AND AXILLARY SENTINEL LYMPH NODE BX;  Surgeon: Kandis Cocking, MD;  Location: [facility] OR;  Service: General;  Laterality: Right;   BREAST SURGERY     MANDIBLE FRACTURE SURGERY     THYROID CYST EXCISION     TUBAL LIGATION       Outpatient Encounter Medications as of 01/01/2023  Medication Sig   ALPRAZolam (XANAX) 0.25 MG tablet Take 1 tablet (0.25 mg total) by mouth at bedtime as needed for anxiety.   amLODipine (NORVASC) 10 MG tablet Take 10 mg by mouth daily.   Ascorbic Acid (VITAMIN C) 100 MG tablet Take 100 mg by mouth daily.   atorvastatin (LIPITOR) 20 MG tablet Take 20 mg by mouth daily.   cholecalciferol (VITAMIN D3) 25 MCG (1000 UT) tablet Take 1,000 Units by mouth daily.   DULoxetine (CYMBALTA) 30 MG capsule TAKE 1 CAPSULE BY MOUTH EVERY DAY   fish oil-omega-3 fatty acids 1000 MG capsule Take 1 g by mouth daily.   losartan-hydrochlorothiazide (HYZAAR) 100-25 MG per tablet Take 1 tablet by mouth  daily.   Multiple Vitamin (MULTIVITAMIN WITH MINERALS) TABS Take 1 tablet by mouth daily.   zinc gluconate 50 MG tablet Take 50 mg by mouth daily.   Facility-Administered Encounter Medications as of 01/01/2023  Medication   diclofenac Sodium (VOLTAREN) 1 % topical gel 2 g     There were no vitals filed for this visit. There is no height or weight on file to calculate BMI.   PHYSICAL EXAM GENERAL:alert, no distress and comfortable SKIN: no rash  EYES: sclera clear NECK: without mass LYMPH:  no palpable cervical or supraclavicular lymphadenopathy  LUNGS: clear with normal breathing effort HEART: regular rate & rhythm, no lower extremity edema ABDOMEN: abdomen soft, non-tender and normal bowel sounds NEURO: alert & oriented x 3 with fluent speech, no focal motor/sensory deficits Breast exam:  PAC without erythema    CBC    Component Value Date/Time   WBC 6.8 02/07/2022 1156   WBC 6.1 02/08/2021 1020   RBC 4.96 02/07/2022 1156   HGB 15.0 02/07/2022 1156   HGB 13.9 01/23/2017 0927   HCT 44.3 02/07/2022 1156   HCT 42.1 01/23/2017 0927   PLT 175 02/07/2022 1156   PLT 146 01/23/2017 0927   MCV 89.3 02/07/2022 1156   MCV 87.8 01/23/2017 0927   MCH 30.2 02/07/2022 1156   MCHC 33.9 02/07/2022 1156   RDW 13.3 02/07/2022 1156   RDW 14.1 01/23/2017 0927  LYMPHSABS 1.7 02/07/2022 1156   LYMPHSABS 1.2 01/23/2017 0927   MONOABS 0.5 02/07/2022 1156   MONOABS 0.4 01/23/2017 0927   EOSABS 0.2 02/07/2022 1156   EOSABS 0.1 01/23/2017 0927   BASOSABS 0.1 02/07/2022 1156   BASOSABS 0.0 01/23/2017 0927     CMP     Component Value Date/Time   NA 139 02/07/2022 1156   NA 142 01/23/2017 0927   K 3.9 02/07/2022 1156   K 3.7 01/23/2017 0927   CL 102 02/07/2022 1156   CL 104 09/19/2012 1106   CO2 30 02/07/2022 1156   CO2 25 01/23/2017 0927   GLUCOSE 99 02/07/2022 1156   GLUCOSE 90 01/23/2017 0927   GLUCOSE 88 09/19/2012 1106   BUN 16 02/07/2022 1156   BUN 13.8 01/23/2017 0927    CREATININE 0.94 02/07/2022 1156   CREATININE 0.8 01/23/2017 0927   CALCIUM 10.6 (H) 02/07/2022 1156   CALCIUM 10.3 01/23/2017 0927   PROT 7.9 02/07/2022 1156   PROT 8.1 01/23/2017 0927   ALBUMIN 4.7 02/07/2022 1156   ALBUMIN 4.4 01/23/2017 0927   AST 21 02/07/2022 1156   AST 21 01/23/2017 0927   ALT 18 02/07/2022 1156   ALT 20 01/23/2017 0927   ALKPHOS 63 02/07/2022 1156   ALKPHOS 86 01/23/2017 0927   BILITOT 1.0 02/07/2022 1156   BILITOT 0.75 01/23/2017 0927   GFRNONAA >60 02/07/2022 1156   GFRAA >60 02/06/2019 0814     ASSESSMENT & PLAN:67 year old female   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  Oncotype 13, low risk -S/p right lumpectomy on 08/20/2012, adjuvant radiation, and adjuvant antiestrogen therapy with anastrozole from 12/11/2012 - 02/11/2020 -Currently under surveillance and high risk screening program including annual mammogram and MRI staggered 6 months apart, last mammogram 11/08/22 was negative, breast density category C    2.  Depression -Last year she reported early morning awakening and fatigue, admits to mild depression -she has close friends who are diagnosed with cancer -Her husband died from cholangiocarcinoma in 2014 just before patient's lumpectomy -Follow-ups here are triggering.  Despite this she prefers to continue follow-up in our clinic   3. Osteopenia -12/2013 DEXA showed osteopenia with lowest T score -1.7.  This worsened on 02/2018 DEXA, T score -2.0 -DEXA 05/2020 improved, continue calcium and vitamin D   4.  Age-appropriate health maintenance -Encouraged her to continue healthy active lifestyle, abstaining from tobacco, limiting alcohol, and staying up-to-date on age-appropriate cancer screenings     PLAN:  No orders of the defined types were placed in this encounter.     All questions were answered. The patient knows to call the clinic with any problems,  questions or concerns. No barriers to learning were detected. I spent *** counseling the patient face to face. The total time spent in the appointment was *** and more than 50% was on counseling, review of test results, and coordination of care.   Sharon Glad, NP-C @DATE @

## 2023-01-01 ENCOUNTER — Other Ambulatory Visit: Payer: Self-pay | Admitting: Family Medicine

## 2023-01-01 ENCOUNTER — Telehealth: Payer: Self-pay | Admitting: Nurse Practitioner

## 2023-01-01 ENCOUNTER — Inpatient Hospital Stay: Payer: Medicare Other

## 2023-01-01 ENCOUNTER — Inpatient Hospital Stay: Payer: Medicare Other | Admitting: Nurse Practitioner

## 2023-01-01 DIAGNOSIS — M8588 Other specified disorders of bone density and structure, other site: Secondary | ICD-10-CM

## 2023-01-07 NOTE — Progress Notes (Unsigned)
Patient Care Team: Elias Else, MD (Inactive) as PCP - General (Family Medicine) Chipper Herb, MD (Inactive) as Consulting Physician (Radiation Oncology) Drue Second, MD as Consulting Physician (Hematology and Oncology)   CHIEF COMPLAINT: Follow up right breast cancer    CURRENT THERAPY: Completed 7 years of AI 01/2020, currently on surveillance   INTERVAL HISTORY Sharon Nguyen returns for follow up as scheduled, last seen by me 02/07/22. She is doing well in general, exercising, and eating well. Up to date on routine health and other cancer screenings. Denies concerns in her breasts such as new lump/mass, nipple discharge/inversion, skin change. Her sister passed from colon cancer in August, she is concerned about her own risk and may want to repeat colonoscopy sooner. Denies red flags such as change in bowel habits, bleeding, pain/bloating, or unintentional weight loss. She does note generalized muscle spasms and may get a "sore" abdomen if she worries about something. Admits it could be anxiety.   ROS  All other systems reviewed and negative  Past Medical History:  Diagnosis Date   Breast cancer (HCC)    right 2014   GERD (gastroesophageal reflux disease)    Hepatitis C    Hypertension    Status post radiation therapy 10/01/12-10/31/12   rt breast/4250cGy 17 sessions/ rt breat boost=750cGy /3 sessions   TB (pulmonary tuberculosis)      Past Surgical History:  Procedure Laterality Date   BREAST LUMPECTOMY Right 2014   BREAST LUMPECTOMY WITH NEEDLE LOCALIZATION AND AXILLARY SENTINEL LYMPH NODE BX Right 08/20/2012   Procedure: BREAST LUMPECTOMY WITH NEEDLE LOCALIZATION AND AXILLARY SENTINEL LYMPH NODE BX;  Surgeon: Kandis Cocking, MD;  Location: [facility];  Service: General;  Laterality: Right;   BREAST SURGERY     MANDIBLE FRACTURE SURGERY     THYROID CYST EXCISION     TUBAL LIGATION       Outpatient Encounter Medications as of 01/09/2023  Medication Sig   ALPRAZolam (XANAX)  0.25 MG tablet Take 1 tablet (0.25 mg total) by mouth at bedtime as needed for anxiety.   amLODipine (NORVASC) 10 MG tablet Take 10 mg by mouth daily.   Ascorbic Acid (VITAMIN C) 100 MG tablet Take 100 mg by mouth daily.   atorvastatin (LIPITOR) 20 MG tablet Take 20 mg by mouth daily.   cholecalciferol (VITAMIN D3) 25 MCG (1000 UT) tablet Take 1,000 Units by mouth daily.   DULoxetine (CYMBALTA) 30 MG capsule TAKE 1 CAPSULE BY MOUTH EVERY DAY   fish oil-omega-3 fatty acids 1000 MG capsule Take 1 g by mouth daily.   losartan-hydrochlorothiazide (HYZAAR) 100-25 MG per tablet Take 1 tablet by mouth daily.   Multiple Vitamin (MULTIVITAMIN WITH MINERALS) TABS Take 1 tablet by mouth daily.   zinc gluconate 50 MG tablet Take 50 mg by mouth daily.   Facility-Administered Encounter Medications as of 01/09/2023  Medication   diclofenac Sodium (VOLTAREN) 1 % topical gel 2 g     Today's Vitals   01/09/23 1102  BP: 119/78  Pulse: 86  Resp: 17  Temp: 98.3 F (36.8 C)  TempSrc: Oral  SpO2: 100%  Weight: 162 lb 9.6 oz (73.8 kg)  Height: 5' 7.5" (1.715 m)   Body mass index is 25.09 kg/m.   PHYSICAL EXAM GENERAL:alert, no distress and comfortable SKIN: no rash  EYES: sclera clear NECK: without mass LYMPH:  no palpable cervical or supraclavicular lymphadenopathy  LUNGS:  normal breathing effort HEART: no lower extremity edema ABDOMEN: abdomen soft, non-tender and normal  bowel sounds NEURO: alert & oriented x 3 with fluent speech, no focal motor/sensory deficits Breast exam: Symmetrical without nipple discharge or inversion.  S/p right lumpectomy, incisions completely healed.  No palpable mass or nodularity in either breast or axilla that I could appreciate.    CBC    Component Value Date/Time   WBC 7.4 01/09/2023 1045   WBC 6.1 02/08/2021 1020   RBC 4.82 01/09/2023 1045   HGB 14.8 01/09/2023 1045   HGB 13.9 01/23/2017 0927   HCT 44.1 01/09/2023 1045   HCT 42.1 01/23/2017 0927   PLT  151 01/09/2023 1045   PLT 146 01/23/2017 0927   MCV 91.5 01/09/2023 1045   MCV 87.8 01/23/2017 0927   MCH 30.7 01/09/2023 1045   MCHC 33.6 01/09/2023 1045   RDW 13.2 01/09/2023 1045   RDW 14.1 01/23/2017 0927   LYMPHSABS 1.9 01/09/2023 1045   LYMPHSABS 1.2 01/23/2017 0927   MONOABS 0.7 01/09/2023 1045   MONOABS 0.4 01/23/2017 0927   EOSABS 0.2 01/09/2023 1045   EOSABS 0.1 01/23/2017 0927   BASOSABS 0.1 01/09/2023 1045   BASOSABS 0.0 01/23/2017 0927     CMP     Component Value Date/Time   NA 142 01/09/2023 1045   NA 142 01/23/2017 0927   K 4.2 01/09/2023 1045   K 3.7 01/23/2017 0927   CL 104 01/09/2023 1045   CL 104 09/19/2012 1106   CO2 30 01/09/2023 1045   CO2 25 01/23/2017 0927   GLUCOSE 97 01/09/2023 1045   GLUCOSE 90 01/23/2017 0927   GLUCOSE 88 09/19/2012 1106   BUN 21 01/09/2023 1045   BUN 13.8 01/23/2017 0927   CREATININE 0.97 01/09/2023 1045   CREATININE 0.8 01/23/2017 0927   CALCIUM 10.8 (H) 01/09/2023 1045   CALCIUM 10.3 01/23/2017 0927   PROT 8.1 01/09/2023 1045   PROT 8.1 01/23/2017 0927   ALBUMIN 4.8 01/09/2023 1045   ALBUMIN 4.4 01/23/2017 0927   AST 22 01/09/2023 1045   AST 21 01/23/2017 0927   ALT 25 01/09/2023 1045   ALT 20 01/23/2017 0927   ALKPHOS 57 01/09/2023 1045   ALKPHOS 86 01/23/2017 0927   BILITOT 0.9 01/09/2023 1045   BILITOT 0.75 01/23/2017 0927   GFRNONAA >60 01/09/2023 1045   GFRAA >60 02/06/2019 0814     ASSESSMENT & PLAN:67 year old female   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  Oncotype 13, low risk -S/p right lumpectomy on 08/20/2012, adjuvant radiation, and adjuvant antiestrogen therapy with anastrozole from 12/11/2012 - 02/11/2020 -Currently under surveillance and high risk screening program including annual mammogram and MRI staggered 6 months apart, last mammogram 11/08/22 was negative, breast density category C. Last MRI 2022 -Sharon Nguyen is  clinically doing well.  Exam is benign, labs are unremarkable.  Recent mammogram was negative.  Overall no clinical concern for new or recurrent breast cancer. -Continue surveillance and screening.  She would like to do an MRI in 04/2023, will arrange and call her with results -Follow-up in 1 year, or sooner if needed   2.  Depression -Last year she reported early morning awakening and fatigue, admits to mild depression -she has close friends who are diagnosed with cancer -Her husband died from cholangiocarcinoma in 2014 just before patient's lumpectomy   3. Osteopenia -12/2013 DEXA showed osteopenia with lowest T score -1.7.  This worsened on 02/2018 DEXA, T score -2.0 -DEXA 05/2020 improved, continue calcium, vitamin D and exercise  4.  Age-appropriate health maintenance -Encouraged her to continue healthy active lifestyle, abstaining from tobacco, limiting alcohol, and staying up-to-date on age-appropriate cancer screenings -Last colonoscopy in 2021 with Dr. Bosie Clos, 10-year recall, but given she had a sister with gastric and another sister with colon cancers and her increased concern, she plans to call Dr. Bosie Clos to discuss earlier screening -Due to her personal and family history of cancers, patient is concerned about her own risk and is interested in speaking with genetic counselor.   -She reportedly had genetic testing in 2014 with breast cancer diagnosis although the report is not available to me and may be eligible for update testing -Referred to genetics     PLAN: -Recent mammogram and today's labs reviewed -Continue breast cancer surveillance -MRI 04/2023, mammo 10/2023 -Continue PCP f/up and routine age-appropriate care and healthy active lifestyle  -Referral to genetics for personal and family history of cancers -F/up in 1 year, or sooner if needed   Orders Placed This Encounter  Procedures   MR BREAST BILATERAL W WO CONTRAST INC CAD    Standing Status:   Future     Standing Expiration Date:   01/09/2024    Order Specific Question:   If indicated for the ordered procedure, I authorize the administration of contrast media per Radiology protocol    Answer:   Yes    Order Specific Question:   What is the patient's sedation requirement?    Answer:   No Sedation    Order Specific Question:   Does the patient have a pacemaker or implanted devices?    Answer:   No    Order Specific Question:   Preferred imaging location?    Answer:   [facility] (table limit-550lbs)   Ambulatory referral to Genetics    Referral Priority:   Routine    Referral Type:   Consultation    Referral Reason:   Specialty Services Required    Number of Visits Requested:   1      All questions were answered. The patient knows to call the clinic with any problems, questions or concerns. No barriers to learning were detected. I spent 20 minutes counseling the patient face to face. The total time spent in the appointment was 30 minutes and more than 50% was on counseling, review of test results, and coordination of care.   Santiago Glad, NP-C 01/09/2023

## 2023-01-09 ENCOUNTER — Inpatient Hospital Stay: Payer: Medicare Other | Attending: Nurse Practitioner

## 2023-01-09 ENCOUNTER — Ambulatory Visit: Payer: Medicare Other | Admitting: Nurse Practitioner

## 2023-01-09 ENCOUNTER — Encounter: Payer: Self-pay | Admitting: Nurse Practitioner

## 2023-01-09 ENCOUNTER — Other Ambulatory Visit: Payer: Medicare Other

## 2023-01-09 ENCOUNTER — Inpatient Hospital Stay (HOSPITAL_BASED_OUTPATIENT_CLINIC_OR_DEPARTMENT_OTHER): Payer: Medicare Other | Admitting: Nurse Practitioner

## 2023-01-09 VITALS — BP 119/78 | HR 86 | Temp 98.3°F | Resp 17 | Ht 67.5 in | Wt 162.6 lb

## 2023-01-09 DIAGNOSIS — Z170 Estrogen receptor positive status [ER+]: Secondary | ICD-10-CM | POA: Diagnosis not present

## 2023-01-09 DIAGNOSIS — Z800 Family history of malignant neoplasm of digestive organs: Secondary | ICD-10-CM | POA: Diagnosis not present

## 2023-01-09 DIAGNOSIS — Z923 Personal history of irradiation: Secondary | ICD-10-CM | POA: Insufficient documentation

## 2023-01-09 DIAGNOSIS — C50511 Malignant neoplasm of lower-outer quadrant of right female breast: Secondary | ICD-10-CM | POA: Diagnosis not present

## 2023-01-09 DIAGNOSIS — Z853 Personal history of malignant neoplasm of breast: Secondary | ICD-10-CM | POA: Insufficient documentation

## 2023-01-09 DIAGNOSIS — F32A Depression, unspecified: Secondary | ICD-10-CM | POA: Insufficient documentation

## 2023-01-09 DIAGNOSIS — E559 Vitamin D deficiency, unspecified: Secondary | ICD-10-CM | POA: Insufficient documentation

## 2023-01-09 DIAGNOSIS — M8580 Other specified disorders of bone density and structure, unspecified site: Secondary | ICD-10-CM | POA: Insufficient documentation

## 2023-01-09 LAB — CMP (CANCER CENTER ONLY)
ALT: 25 U/L (ref 0–44)
AST: 22 U/L (ref 15–41)
Albumin: 4.8 g/dL (ref 3.5–5.0)
Alkaline Phosphatase: 57 U/L (ref 38–126)
Anion gap: 8 (ref 5–15)
BUN: 21 mg/dL (ref 8–23)
CO2: 30 mmol/L (ref 22–32)
Calcium: 10.8 mg/dL — ABNORMAL HIGH (ref 8.9–10.3)
Chloride: 104 mmol/L (ref 98–111)
Creatinine: 0.97 mg/dL (ref 0.44–1.00)
GFR, Estimated: >60 mL/min (ref >60)
Glucose, Bld: 97 mg/dL (ref 70–99)
Potassium: 4.2 mmol/L (ref 3.5–5.1)
Sodium: 142 mmol/L (ref 135–145)
Total Bilirubin: 0.9 mg/dL (ref 0.3–1.2)
Total Protein: 8.1 g/dL (ref 6.5–8.1)

## 2023-01-09 LAB — CBC WITH DIFFERENTIAL (CANCER CENTER ONLY)
Abs Immature Granulocytes: 0.02 10*3/uL (ref 0.00–0.07)
Basophils Absolute: 0.1 10*3/uL (ref 0.0–0.1)
Basophils Relative: 1 %
Eosinophils Absolute: 0.2 10*3/uL (ref 0.0–0.5)
Eosinophils Relative: 3 %
HCT: 44.1 % (ref 36.0–46.0)
Hemoglobin: 14.8 g/dL (ref 12.0–15.0)
Immature Granulocytes: 0 %
Lymphocytes Relative: 26 %
Lymphs Abs: 1.9 10*3/uL (ref 0.7–4.0)
MCH: 30.7 pg (ref 26.0–34.0)
MCHC: 33.6 g/dL (ref 30.0–36.0)
MCV: 91.5 fL (ref 80.0–100.0)
Monocytes Absolute: 0.7 10*3/uL (ref 0.1–1.0)
Monocytes Relative: 9 %
Neutro Abs: 4.5 10*3/uL (ref 1.7–7.7)
Neutrophils Relative %: 61 %
Platelet Count: 151 10*3/uL (ref 150–400)
RBC: 4.82 MIL/uL (ref 3.87–5.11)
RDW: 13.2 % (ref 11.5–15.5)
WBC Count: 7.4 10*3/uL (ref 4.0–10.5)
nRBC: 0 % (ref 0.0–0.2)

## 2023-01-09 LAB — VITAMIN D 25 HYDROXY (VIT D DEFICIENCY, FRACTURES): Vit D, 25-Hydroxy: 36.09 ng/mL (ref 30–100)

## 2023-02-08 ENCOUNTER — Ambulatory Visit: Payer: Medicare Other | Admitting: Nurse Practitioner

## 2023-02-08 ENCOUNTER — Other Ambulatory Visit: Payer: Medicare Other

## 2023-02-19 ENCOUNTER — Encounter: Payer: Self-pay | Admitting: Genetic Counselor

## 2023-02-20 ENCOUNTER — Inpatient Hospital Stay: Payer: Medicare Other | Admitting: Genetic Counselor

## 2023-02-20 ENCOUNTER — Inpatient Hospital Stay: Payer: Medicare Other

## 2023-03-12 ENCOUNTER — Other Ambulatory Visit: Payer: Medicare Other

## 2023-03-27 ENCOUNTER — Inpatient Hospital Stay: Payer: Medicare Other | Attending: Nurse Practitioner | Admitting: Genetic Counselor

## 2023-03-27 ENCOUNTER — Inpatient Hospital Stay: Payer: Medicare Other

## 2023-03-27 ENCOUNTER — Other Ambulatory Visit: Payer: Self-pay | Admitting: Genetic Counselor

## 2023-03-27 ENCOUNTER — Other Ambulatory Visit: Payer: Self-pay

## 2023-03-27 DIAGNOSIS — Z170 Estrogen receptor positive status [ER+]: Secondary | ICD-10-CM | POA: Diagnosis not present

## 2023-03-27 DIAGNOSIS — Z1379 Encounter for other screening for genetic and chromosomal anomalies: Secondary | ICD-10-CM

## 2023-03-27 DIAGNOSIS — Z800 Family history of malignant neoplasm of digestive organs: Secondary | ICD-10-CM | POA: Diagnosis not present

## 2023-03-27 DIAGNOSIS — C50511 Malignant neoplasm of lower-outer quadrant of right female breast: Secondary | ICD-10-CM

## 2023-03-27 DIAGNOSIS — Z808 Family history of malignant neoplasm of other organs or systems: Secondary | ICD-10-CM

## 2023-03-27 DIAGNOSIS — Z803 Family history of malignant neoplasm of breast: Secondary | ICD-10-CM

## 2023-03-27 LAB — GENETIC SCREENING ORDER

## 2023-03-28 ENCOUNTER — Encounter: Payer: Self-pay | Admitting: Genetic Counselor

## 2023-03-28 NOTE — Progress Notes (Signed)
REFERRING PROVIDER: Pollyann Samples, NP [address] [place],  [place] [address]  PRIMARY PROVIDER:  Elias Else, MD (Inactive)  PRIMARY REASON FOR VISIT:  1. Malignant neoplasm of lower-outer quadrant of right breast of female, estrogen receptor positive (HCC)   2. Family history of malignant neoplasm of breast   3. Family history of malignant neoplasm of gastrointestinal tract     HISTORY OF PRESENT ILLNESS:   Sharon Nguyen, a 67 y.o. female, was seen for a [address] cancer genetics consultation at the request of Dr. Azucena Cecil due to a personal and family history of cancer.  Sharon Nguyen presents to clinic today to discuss the possibility of a hereditary predisposition to cancer, to discuss genetic testing, and to further clarify her future cancer risks, as well as potential cancer risks for family members.   CANCER HISTORY:  Sharon Nguyen was diagnosed with invasive ductal carcinoma of the right breast (ER/PR positive, HER2 negative) at age 44. She had Negative hereditary cancer genetic testing ([facility] OvaNext- 24 genes) in 2016.  RISK FACTORS:  Menarche was at age 36.  First live birth at age 7.  OCP use for approximately 5 years.  Ovaries intact: yes.  Uterus intact: yes.  Menopausal status: postmenopausal.  HRT use: 5 years. Colonoscopy: yes;  unsure if she had polyps . Any excessive radiation exposure in the past: no  Past Medical History:  Diagnosis Date   Breast cancer (HCC)    right 2014   GERD (gastroesophageal reflux disease)    Hepatitis C    Hypertension    Status post radiation therapy 10/01/12-10/31/12   rt breast/4250cGy 17 sessions/ rt breat boost=750cGy /3 sessions   TB (pulmonary tuberculosis)     Past Surgical History:  Procedure Laterality Date   BREAST LUMPECTOMY Right 2014   BREAST LUMPECTOMY WITH NEEDLE LOCALIZATION AND AXILLARY SENTINEL LYMPH NODE BX Right 08/20/2012   Procedure: BREAST LUMPECTOMY WITH NEEDLE LOCALIZATION AND AXILLARY SENTINEL LYMPH NODE  BX;  Surgeon: Kandis Cocking, MD;  Location: [facility];  Service: General;  Laterality: Right;   BREAST SURGERY     MANDIBLE FRACTURE SURGERY     THYROID CYST EXCISION     TUBAL LIGATION      Social History   Socioeconomic History   Marital status: Widowed    Spouse name: Not on file   Number of children: 4   Years of education: Not on file   Highest education level: Not on file  Occupational History   Occupation: Animal nutritionist: [employer]    Comment: Arts administrator at [employer]  Tobacco Use   Smoking status: Former   Smokeless tobacco: Never  Substance and Sexual Activity   Alcohol use: No   Drug use: No   Sexual activity: Not Currently  Other Topics Concern   Not on file  Social History Narrative   Recently widowed April 2014 from bile duct cancer.   Has 4 sons 2 in [place], 1 in [place] and 1 in [place]   Social Determinants of Health   Financial Resource Strain: Low Risk  (02/10/2021)   Overall Financial Resource Strain (CARDIA)    Difficulty of Paying Living Expenses: Not hard at all  Food Insecurity: No Food Insecurity (02/10/2021)   Hunger Vital Sign    Worried About Running Out of Food in the Last Year: Never true    Ran Out of Food in the Last Year: Never true  Transportation Needs:  No Transportation Needs (02/10/2021)   PRAPARE - Administrator, Civil Service (Medical): No    Lack of Transportation (Non-Medical): No  Physical Activity: Not on file  Stress: Not on file  Social Connections: Socially Isolated (02/10/2021)   Social Connection and Isolation Panel [NHANES]    Frequency of Communication with Friends and Family: Twice a week    Frequency of Social Gatherings with Friends and Family: Once a week    Attends Religious Services: Never    Database administrator or Organizations: No    Attends Banker Meetings: Never    Marital Status: Widowed     FAMILY HISTORY:  We obtained a detailed,  4-generation family history.  Significant diagnoses are listed below: Family History  Problem Relation Age of Onset   Mesothelioma Father 25   Colon cancer Father 27   Gastric cancer Sister 9       gastric cancer    Colon cancer Sister 48   Pancreatic cancer Maternal Uncle 38       pancreatic   Breast cancer Half-Sister 37       paternal half-sister     Sharon Nguyen reports one of her sister's who has no history of cancer had genetic testing but she does not know the results. There is no reported Ashkenazi Jewish ancestry.   GENETIC COUNSELING ASSESSMENT: Sharon Nguyen is a 67 y.o. female with a personal and family history of cancer which is somewhat suggestive of a hereditary predisposition to cancer. We, therefore, discussed and recommended the following at today's visit.   DISCUSSION: We discussed that 5 - 10% of cancer is hereditary, with most cases of breast cancer associated with BRCA1/2.  There are other genes that can be associated with hereditary breast cancer syndromes.  We discussed that testing is beneficial for several reasons including knowing how to follow individuals after completing their treatment, identifying whether potential treatment options would be beneficial, and understanding if other family members could be at risk for cancer and allowing them to undergo genetic testing.   We reviewed the characteristics, features and inheritance patterns of hereditary cancer syndromes. We also discussed genetic testing, including the appropriate family members to test, the process of testing, insurance coverage and turn-around-time for results. We discussed the implications of a negative, positive, carrier and/or variant of uncertain significant result.   We reviewed her prior genetic testing results and the option to pursue updated testing. We discussed there are additional genes we can test that she was not tested for in 2016 and we can look at genes more closely now with RNA testing.  Sharon Nguyen elected to pursue updated testing if the cost is $100 or less. We have ordered [facility] CancerNext-Expanded Panel and let her know she needs to call the lab to discuss cost.   The CancerNext-Expanded gene panel offered by [employer] and includes sequencing, rearrangement, and RNA analysis for the following 76 genes: AIP, ALK, APC, ATM, AXIN2, BAP1, BARD1, BMPR1A, BRCA1, BRCA2, BRIP1, CDC73, CDH1, CDK4, CDKN1B, CDKN2A, CEBPA, CHEK2, CTNNA1, DDX41, DICER1, ETV6, FH, FLCN, GATA2, LZTR1, MAX, MBD4, MEN1, MET, MLH1, MSH2, MSH3, MSH6, MUTYH, NF1, NF2, NTHL1, PALB2, PHOX2B, PMS2, POT1, PRKAR1A, PTCH1, PTEN, RAD51C, RAD51D, RB1, RET, RUNX1, SDHA, SDHAF2, SDHB, SDHC, SDHD, SMAD4, SMARCA4, SMARCB1, SMARCE1, STK11, SUFU, TMEM127, TP53, TSC1, TSC2, VHL, and WT1 (sequencing and deletion/duplication); EGFR, HOXB13, KIT, MITF, PDGFRA, POLD1, and POLE (sequencing only); EPCAM and GREM1 (deletion/duplication only).    PLAN: After considering the  risks, benefits, and limitations, Sharon Nguyen provided informed consent to pursue genetic testing and the blood sample was sent to [facility] for analysis of the CancerNext-Expanded Panel. However, testing will be placed on hold until she discusses OOP cost with the laboratory. Results should be available within approximately 2-3 weeks' time, at which point they will be disclosed by telephone to Sharon Nguyen, as will any additional recommendations warranted by these results. Sharon Nguyen will receive a summary of her genetic counseling visit and a copy of her results once available. This information will also be available in Epic.   Sharon Nguyen questions were answered to her satisfaction today. Our contact information was provided should additional questions or concerns arise. Thank you for the referral and allowing [place] to share in the care of your patient.   Lalla Brothers, MS, [facility] Genetic Counselor [email or web address] (P) [phone]  The patient was seen  for a total of 30 minutes in face-to-face genetic counseling. The patient was seen alone.  Drs. Pamelia Hoit and/or Mosetta Putt were available to discuss this case as needed.   _______________________________________________________________________ For Office Staff:  Number of people involved in session: 1 Was an Intern/ student involved with case: no

## 2023-04-23 ENCOUNTER — Encounter: Payer: Self-pay | Admitting: Genetic Counselor

## 2023-04-23 ENCOUNTER — Telehealth: Payer: Self-pay | Admitting: Genetic Counselor

## 2023-04-23 DIAGNOSIS — Z1379 Encounter for other screening for genetic and chromosomal anomalies: Secondary | ICD-10-CM | POA: Insufficient documentation

## 2023-04-24 ENCOUNTER — Ambulatory Visit: Payer: Self-pay | Admitting: Genetic Counselor

## 2023-04-24 ENCOUNTER — Encounter: Payer: Self-pay | Admitting: Genetic Counselor

## 2023-04-24 DIAGNOSIS — Z1379 Encounter for other screening for genetic and chromosomal anomalies: Secondary | ICD-10-CM

## 2023-04-24 NOTE — Progress Notes (Signed)
 HPI:   Sharon Nguyen was previously seen in the [place] [facility] due to a personal and family history of cancer and concerns regarding a hereditary predisposition to cancer. Please refer to our prior cancer genetics clinic note for more information regarding our discussion, assessment and recommendations, at the time. Ms. Faraci recent genetic test results were disclosed to her, as were recommendations warranted by these results. These results and recommendations are discussed in more detail below.  CANCER HISTORY:  Oncology History  Cancer of lower-outer quadrant of female breast (HCC)  06/13/2012 Initial Diagnosis   Cancer of lower-outer quadrant of female breast [facility])    Genetic Testing   [facility] CancerNext-Expanded Panel+RNA was Negative. Report date is 04/20/2023.  The CancerNext-Expanded gene panel offered by [facility] and includes sequencing, rearrangement, and RNA analysis for the following 76 genes: AIP, ALK, APC, ATM, AXIN2, BAP1, BARD1, BMPR1A, BRCA1, BRCA2, BRIP1, CDC73, CDH1, CDK4, CDKN1B, CDKN2A, CEBPA, CHEK2, CTNNA1, DDX41, DICER1, ETV6, FH, FLCN, GATA2, LZTR1, MAX, MBD4, MEN1, MET, MLH1, MSH2, MSH3, MSH6, MUTYH, NF1, NF2, NTHL1, PALB2, PHOX2B, PMS2, POT1, PRKAR1A, PTCH1, PTEN, RAD51C, RAD51D, RB1, RET, RUNX1, SDHA, SDHAF2, SDHB, SDHC, SDHD, SMAD4, SMARCA4, SMARCB1, SMARCE1, STK11, SUFU, TMEM127, TP53, TSC1, TSC2, VHL, and WT1 (sequencing and deletion/duplication); EGFR, HOXB13, KIT, MITF, PDGFRA, POLD1, and POLE (sequencing only); EPCAM and GREM1 (deletion/duplication only).       FAMILY HISTORY:  We obtained a detailed, 4-generation family history.  Significant diagnoses are listed below:      Family History  Problem Relation Age of Onset   Mesothelioma Father 80   Colon cancer Father 8   Gastric cancer Sister 17        gastric cancer    Colon cancer Sister 22   Pancreatic cancer Maternal Uncle 26        pancreatic   Breast cancer Half-Sister 39         paternal half-sister           Ms. Lindfors reports one of her sister's who has no history of cancer had genetic testing but she does not know the results. There is no reported Ashkenazi Jewish ancestry.    GENETIC TEST RESULTS:  The [facility] CancerNext-Expanded Panel found no pathogenic mutations.   The CancerNext-Expanded gene panel offered by [facility] and includes sequencing, rearrangement, and RNA analysis for the following 76 genes: AIP, ALK, APC, ATM, AXIN2, BAP1, BARD1, BMPR1A, BRCA1, BRCA2, BRIP1, CDC73, CDH1, CDK4, CDKN1B, CDKN2A, CEBPA, CHEK2, CTNNA1, DDX41, DICER1, ETV6, FH, FLCN, GATA2, LZTR1, MAX, MBD4, MEN1, MET, MLH1, MSH2, MSH3, MSH6, MUTYH, NF1, NF2, NTHL1, PALB2, PHOX2B, PMS2, POT1, PRKAR1A, PTCH1, PTEN, RAD51C, RAD51D, RB1, RET, RUNX1, SDHA, SDHAF2, SDHB, SDHC, SDHD, SMAD4, SMARCA4, SMARCB1, SMARCE1, STK11, SUFU, TMEM127, TP53, TSC1, TSC2, VHL, and WT1 (sequencing and deletion/duplication); EGFR, HOXB13, KIT, MITF, PDGFRA, POLD1, and POLE (sequencing only); EPCAM and GREM1 (deletion/duplication only).     The test report has been scanned into EPIC and is located under the Molecular Pathology section of the Results Review tab.  A portion of the result report is included below for reference. Genetic testing reported out on 04/20/2023.       Even though a pathogenic variant was not identified, possible explanations for the cancer in the family may include: There may be no hereditary risk for cancer in the family. The cancers in Ms. Dovidio and/or her family may be due to other genetic or environmental factors. There may be a gene mutation in one of these genes  that current testing methods cannot detect, but that chance is small. There could be another gene that has not yet been discovered, or that we have not yet tested, that is responsible for the cancer diagnoses in the family.  It is also possible there is a hereditary cause for the cancer in the family that Ms. Giuliano did not  inherit.  Therefore, it is important to remain in touch with cancer genetics in the future so that we can continue to offer Ms. Creswell the most up to date genetic testing.   ADDITIONAL GENETIC TESTING:  We discussed with Ms. Kohen that her genetic testing was fairly extensive.  If there are genes identified to increase cancer risk that can be analyzed in the future, we would be happy to discuss and coordinate this testing at that time.    CANCER SCREENING RECOMMENDATIONS:  Ms. Horsford test result is considered negative (normal).  This means that we have not identified a hereditary cause for her personal and family history of cancer at this time.  An individual's cancer risk and medical management are not determined by genetic test results alone. Overall cancer risk assessment incorporates additional factors, including personal medical history, family history, and any available genetic information that may result in a personalized plan for cancer prevention and surveillance. Therefore, it is recommended she continue to follow the cancer management and screening guidelines provided by her oncology and primary healthcare provider.  Colon Cancer Screening: Due to Ms. Harren's family history of colon cancer, she is recommended to repeat colonoscopies every 5 years. More frequent colonoscopies may be recommended if polyps are identified.  RECOMMENDATIONS FOR FAMILY MEMBERS:   Since she did not inherit a mutation in a cancer predisposition gene included on this panel, her children could not have inherited a mutation from her in one of these genes. Individuals in this family might be at some increased risk of developing cancer, over the general population risk, due to the family history of cancer. We recommend women in this family have a yearly mammogram beginning at age 54, or 55 years younger than the earliest onset of cancer, an annual clinical breast exam, and perform monthly breast self-exams.  Other  members of the family may still carry a pathogenic variant in one of these genes that Ms. Bhavsar did not inherit. Based on the family history, we recommend her siblings have genetic counseling and testing.   FOLLOW-UP:  Cancer genetics is a rapidly advancing field and it is possible that new genetic tests will be appropriate for her and/or her family members in the future. We encouraged her to remain in contact with cancer genetics on an annual basis so we can update her personal and family histories and let her know of advances in cancer genetics that may benefit this family.   Our contact number was provided. Ms. Lequire questions were answered to her satisfaction, and she knows she is welcome to call us  at anytime with additional questions or concerns.   Jentzen Minasyan, MS, [facility] Genetic Counselor [email or web address] (P) [phone]

## 2023-07-11 ENCOUNTER — Other Ambulatory Visit: Payer: Self-pay | Admitting: Family Medicine

## 2023-07-11 DIAGNOSIS — R6889 Other general symptoms and signs: Secondary | ICD-10-CM

## 2023-08-16 ENCOUNTER — Other Ambulatory Visit: Payer: Self-pay | Admitting: Family Medicine

## 2023-08-16 DIAGNOSIS — M8588 Other specified disorders of bone density and structure, other site: Secondary | ICD-10-CM

## 2023-08-23 ENCOUNTER — Other Ambulatory Visit: Payer: Medicare Other

## 2023-09-18 ENCOUNTER — Other Ambulatory Visit

## 2023-09-20 ENCOUNTER — Ambulatory Visit
Admission: RE | Admit: 2023-09-20 | Discharge: 2023-09-20 | Disposition: A | Discharge status: Home / Self Care | Source: Ambulatory Visit | Attending: Family Medicine | Admitting: Family Medicine

## 2023-09-20 ENCOUNTER — Other Ambulatory Visit: Payer: Self-pay | Admitting: Family Medicine

## 2023-09-20 DIAGNOSIS — R6889 Other general symptoms and signs: Secondary | ICD-10-CM

## 2023-10-18 ENCOUNTER — Other Ambulatory Visit: Payer: Self-pay | Admitting: Hematology

## 2023-10-18 DIAGNOSIS — Z1231 Encounter for screening mammogram for malignant neoplasm of breast: Secondary | ICD-10-CM

## 2023-12-03 ENCOUNTER — Ambulatory Visit
Admission: RE | Admit: 2023-12-03 | Discharge: 2023-12-03 | Disposition: A | Discharge status: Home / Self Care | Source: Ambulatory Visit | Attending: Hematology

## 2023-12-03 DIAGNOSIS — Z1231 Encounter for screening mammogram for malignant neoplasm of breast: Secondary | ICD-10-CM

## 2024-01-07 ENCOUNTER — Other Ambulatory Visit: Payer: Self-pay

## 2024-01-07 DIAGNOSIS — C50511 Malignant neoplasm of lower-outer quadrant of right female breast: Secondary | ICD-10-CM

## 2024-01-07 NOTE — Progress Notes (Unsigned)
 [facility]   Telephone:([phone]) [phone] [phone]    Patient Care Team: Gib Charleston, MD as PCP - General (Family Medicine) Jason Charleston, MD (Inactive) as Consulting Physician (Radiation Oncology) Fernand Evans, MD as Consulting Physician (Hematology and Oncology)   I connected with Sharon Nguyen on 01/08/24 at 11:00 AM EDT by telephone visit and verified that I am speaking with the correct person using two identifiers.   I discussed the limitations, risks, security and privacy concerns of performing an evaluation and management service by telemedicine and the availability of in-person appointments. I also discussed with the patient that there may be a patient responsible charge related to this service. The patient expressed understanding and agreed to proceed.   Other persons participating in the visit and their role in the encounter: None   Patient's location: [place]ail salon  Provider's location: Home     CHIEF COMPLAINT: Follow up remote h/o breast cancer   CURRENT THERAPY: Surveillance   INTERVAL HISTORY Sharon Nguyen came in for labs and presents by phone. Last seen by me 12/2022. Recent mammogram was negative/normal. Denies breast concerns such as new lump/mass, nipple discharge, or skin change. Energy and appetite are normal. Has occasional mild headaches that concern her due to knowing people with cancer. Thinks it could be related to her eyes. Denies associated n/v, dizziness. Denies unintentional weight loss, bone pain, change in bowel habits, bloody stools, or any other new or specific concerns. In general she is very worried about getting another cancer    ROS  All other systems reviewed and negative   Past Medical History:  Diagnosis Date   Breast cancer (HCC)    right 2014   GERD (gastroesophageal reflux disease)    Hepatitis C    Hypertension    Status post radiation therapy 10/01/12-10/31/12   rt breast/4250cGy 17 sessions/ rt breat  boost=750cGy /3 sessions   TB (pulmonary tuberculosis)      Past Surgical History:  Procedure Laterality Date   BREAST LUMPECTOMY Right 2014   BREAST LUMPECTOMY WITH NEEDLE LOCALIZATION AND AXILLARY SENTINEL LYMPH NODE BX Right 08/20/2012   Procedure: BREAST LUMPECTOMY WITH NEEDLE LOCALIZATION AND AXILLARY SENTINEL LYMPH NODE BX;  Surgeon: Alm VEAR Angle, MD;  Location: [facility] OR;  Service: General;  Laterality: Right;   BREAST SURGERY     MANDIBLE FRACTURE SURGERY     THYROID  CYST EXCISION     TUBAL LIGATION       Outpatient Encounter Medications as of 01/08/2024  Medication Sig   ALPRAZolam  (XANAX ) 0.25 MG tablet Take 1 tablet (0.25 mg total) by mouth at bedtime as needed for anxiety.   amLODipine (NORVASC) 10 MG tablet Take 10 mg by mouth daily.   Ascorbic Acid (VITAMIN C) 100 MG tablet Take 100 mg by mouth daily.   atorvastatin (LIPITOR) 20 MG tablet Take 20 mg by mouth daily.   cholecalciferol (VITAMIN D3) 25 MCG (1000 UT) tablet Take 1,000 Units by mouth daily.   DULoxetine  (CYMBALTA ) 30 MG capsule TAKE 1 CAPSULE BY MOUTH EVERY DAY   fish oil-omega-3 fatty acids 1000 MG capsule Take 1 g by mouth daily.   losartan-hydrochlorothiazide (HYZAAR) 100-25 MG per tablet Take 1 tablet by mouth daily.   Multiple Vitamin (MULTIVITAMIN WITH MINERALS) TABS Take 1 tablet by mouth daily.   zinc gluconate 50 MG tablet Take 50 mg by mouth daily.   Facility-Administered Encounter Medications as of 01/08/2024  Medication   diclofenac  Sodium (VOLTAREN ) 1 %  topical gel 2 g     There were no vitals filed for this visit. There is no height or weight on file to calculate BMI.   ECOG PERFORMANCE STATUS: 0 - Asymptomatic  PHYSICAL EXAM Well appearing by phone, voice is strong, speech is clear. Mood/affect appropriate. No cough or conversational dyspnea Breast exam: no done, virtual visit    CBC    Latest Ref Rng & Units 01/09/2023   10:45 AM 02/07/2022   11:56 AM 02/08/2021   10:20 AM  CBC  WBC  4.0 - 10.5 K/uL 7.4  6.8  6.1   Hemoglobin 12.0 - 15.0 g/dL 85.1  84.9  85.4   Hematocrit 36.0 - 46.0 % 44.1  44.3  41.9   Platelets 150 - 400 K/uL 151  175  162       CMP     Latest Ref Rng & Units 01/09/2023   10:45 AM 02/07/2022   11:56 AM 02/08/2021   10:20 AM  CMP  Glucose 70 - 99 mg/dL 97  99  86   BUN 8 - 23 mg/dL 21  16  18    Creatinine 0.44 - 1.00 mg/dL 9.02  9.05  9.13   Sodium 135 - 145 mmol/L 142  139  142   Potassium 3.5 - 5.1 mmol/L 4.2  3.9  3.7   Chloride 98 - 111 mmol/L 104  102  106   CO2 22 - 32 mmol/L 30  30  25    Calcium 8.9 - 10.3 mg/dL 89.1  89.3  89.7   Total Protein 6.5 - 8.1 g/dL 8.1  7.9  7.8   Total Bilirubin 0.3 - 1.2 mg/dL 0.9  1.0  0.7   Alkaline Phos 38 - 126 U/L 57  63  60   AST 15 - 41 U/L 22  21  16    ALT 0 - 44 U/L 25  18  14        ASSESSMENT & PLAN: 68 year old female   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  Oncotype 13, low risk -S/p right lumpectomy on 08/20/2012, adjuvant radiation, and adjuvant antiestrogen therapy with anastrozole  from 12/11/2012 - 02/11/2020 -Currently under surveillance. Last screening breast MRI 2022, negative.  -Sharon Nguyen appears to be doing very well. Labs unremarkable. Recent mammogram was benign. Overall no clinical concern for recurrence.  -Headaches are likely benign, she will get her eyes checked. No associated red flags -She is concerned about future malignancies. UTD on cancer screenings.  -Discussed ctDNA, will see if signatera can be done on her path from 2014. She is interested if this is an option.  -Otherwise continue cancer surveillance   Osteopenia -12/2013 DEXA showed osteopenia with lowest T score -1.7.  This worsened on 02/2018 DEXA, T score -2.0 -DEXA 05/2020 improved, continue calcium, vitamin D  and exercise -Ca slightly elevated likely due to calcium supplement    Age-appropriate health maintenance -Encouraged  her to continue healthy active lifestyle, abstaining from tobacco, limiting alcohol, and staying up-to-date on age-appropriate cancer screenings -Due colonoscopy in 2026. She can discuss with PCP re: cologuard this year  -genetic testing in 2014 and 2024 were negative      PLAN: -Recent mammogram and today's labs reviewed -Continue breast cancer surveillance  -F/up re: signatera  -Discuss cologuard with PCP -F/up with me in 1 year, or sooner if needed    I discussed the assessment and treatment plan with the patient. The patient  was provided an opportunity to ask questions and all were answered. The patient agreed with the plan and demonstrated an understanding of the instructions.   The patient was advised to call back or seek an in-person evaluation if the symptoms worsen or if the condition fails to improve as anticipated. The total time spent in the appointment was 10 minutes and more than 50% was on counseling, review of test results, and coordination of care.   Djon Tith K Alfa Leibensperger, NP 01/07/2024

## 2024-01-08 ENCOUNTER — Inpatient Hospital Stay: Payer: Medicare Other | Admitting: Nurse Practitioner

## 2024-01-08 ENCOUNTER — Inpatient Hospital Stay: Payer: Medicare Other | Attending: Genetic Counselor

## 2024-01-08 ENCOUNTER — Encounter: Payer: Self-pay | Admitting: Nurse Practitioner

## 2024-01-08 DIAGNOSIS — C50511 Malignant neoplasm of lower-outer quadrant of right female breast: Secondary | ICD-10-CM | POA: Diagnosis not present

## 2024-01-08 DIAGNOSIS — Z1732 Human epidermal growth factor receptor 2 negative status: Secondary | ICD-10-CM

## 2024-01-08 DIAGNOSIS — Z923 Personal history of irradiation: Secondary | ICD-10-CM | POA: Diagnosis not present

## 2024-01-08 DIAGNOSIS — M8580 Other specified disorders of bone density and structure, unspecified site: Secondary | ICD-10-CM

## 2024-01-08 DIAGNOSIS — Z170 Estrogen receptor positive status [ER+]: Secondary | ICD-10-CM | POA: Diagnosis not present

## 2024-01-08 DIAGNOSIS — Z853 Personal history of malignant neoplasm of breast: Secondary | ICD-10-CM | POA: Diagnosis not present

## 2024-01-08 DIAGNOSIS — Z1721 Progesterone receptor positive status: Secondary | ICD-10-CM

## 2024-01-08 LAB — CBC WITH DIFFERENTIAL (CANCER CENTER ONLY)
Abs Immature Granulocytes: 0.02 K/uL (ref 0.00–0.07)
Basophils Absolute: 0.1 K/uL (ref 0.0–0.1)
Basophils Relative: 1 %
Eosinophils Absolute: 0.2 K/uL (ref 0.0–0.5)
Eosinophils Relative: 3 %
HCT: 42.9 % (ref 36.0–46.0)
Hemoglobin: 14.3 g/dL (ref 12.0–15.0)
Immature Granulocytes: 0 %
Lymphocytes Relative: 19 %
Lymphs Abs: 1.3 K/uL (ref 0.7–4.0)
MCH: 29.8 pg (ref 26.0–34.0)
MCHC: 33.3 g/dL (ref 30.0–36.0)
MCV: 89.4 fL (ref 80.0–100.0)
Monocytes Absolute: 0.5 K/uL (ref 0.1–1.0)
Monocytes Relative: 7 %
Neutro Abs: 4.9 K/uL (ref 1.7–7.7)
Neutrophils Relative %: 70 %
Platelet Count: 159 K/uL (ref 150–400)
RBC: 4.8 MIL/uL (ref 3.87–5.11)
RDW: 13.3 % (ref 11.5–15.5)
WBC Count: 7 K/uL (ref 4.0–10.5)
nRBC: 0 % (ref 0.0–0.2)

## 2024-01-08 LAB — CMP (CANCER CENTER ONLY)
ALT: 17 U/L (ref 0–44)
AST: 18 U/L (ref 15–41)
Albumin: 4.8 g/dL (ref 3.5–5.0)
Alkaline Phosphatase: 64 U/L (ref 38–126)
Anion gap: 6 (ref 5–15)
BUN: 21 mg/dL (ref 8–23)
CO2: 32 mmol/L (ref 22–32)
Calcium: 10.9 mg/dL — ABNORMAL HIGH (ref 8.9–10.3)
Chloride: 103 mmol/L (ref 98–111)
Creatinine: 0.88 mg/dL (ref 0.44–1.00)
GFR, Estimated: >60 mL/min (ref >60)
Glucose, Bld: 88 mg/dL (ref 70–99)
Potassium: 3.6 mmol/L (ref 3.5–5.1)
Sodium: 141 mmol/L (ref 135–145)
Total Bilirubin: 0.6 mg/dL (ref 0.0–1.2)
Total Protein: 8.1 g/dL (ref 6.5–8.1)

## 2024-01-08 LAB — VITAMIN D 25 HYDROXY (VIT D DEFICIENCY, FRACTURES): Vit D, 25-Hydroxy: 41.3 ng/mL (ref 30–100)

## 2024-01-09 ENCOUNTER — Other Ambulatory Visit: Payer: Self-pay

## 2024-01-14 ENCOUNTER — Other Ambulatory Visit

## 2024-01-14 ENCOUNTER — Other Ambulatory Visit: Payer: Self-pay

## 2024-01-14 ENCOUNTER — Telehealth: Payer: Self-pay

## 2024-01-14 DIAGNOSIS — C50511 Malignant neoplasm of lower-outer quadrant of right female breast: Secondary | ICD-10-CM

## 2024-01-14 NOTE — Telephone Encounter (Signed)
 Spoke with pt via telephone to inform pt that Lacie Burton, NP has reviewed the pt's recent labs and they were WNL.  Stated that Lacie would like for the pt to do a [facility] Reveal.  Pt agreed to having the [facility] Reveal and requested if she could be scheduled to have the lab drawn this Friday, 01/18/2024.  Pt confirmed appt.  Verbal order w/readback order from Lacie Burton, NP for [facility] Reveal to be drawn on 01/18/2024.  Order placed in EPIC and in [employer] portal.  Requisition given to [facility] Lab Receptionist.

## 2024-01-18 ENCOUNTER — Inpatient Hospital Stay

## 2024-01-18 DIAGNOSIS — C50511 Malignant neoplasm of lower-outer quadrant of right female breast: Secondary | ICD-10-CM

## 2024-01-30 ENCOUNTER — Encounter: Payer: Self-pay | Admitting: Nurse Practitioner

## 2024-02-04 LAB — GUARDANT REVEAL

## 2024-02-06 ENCOUNTER — Ambulatory Visit: Payer: Self-pay | Admitting: Nurse Practitioner

## 2024-03-04 ENCOUNTER — Other Ambulatory Visit: Payer: Self-pay | Admitting: Family Medicine

## 2024-03-04 DIAGNOSIS — R519 Headache, unspecified: Secondary | ICD-10-CM

## 2024-03-26 ENCOUNTER — Ambulatory Visit
Admission: RE | Admit: 2024-03-26 | Discharge: 2024-03-26 | Disposition: A | Source: Ambulatory Visit | Attending: Family Medicine | Admitting: Family Medicine

## 2024-03-26 DIAGNOSIS — R519 Headache, unspecified: Secondary | ICD-10-CM

## 2024-05-02 ENCOUNTER — Other Ambulatory Visit

## 2024-05-06 ENCOUNTER — Telehealth: Payer: Self-pay | Admitting: Diagnostic Neuroimaging

## 2024-05-06 NOTE — Telephone Encounter (Signed)
 LVM & sent mychart informing pt her 3/31 appt needs to be rescheduled. MD out

## 2024-07-22 ENCOUNTER — Ambulatory Visit: Admitting: Diagnostic Neuroimaging

## 2024-07-29 ENCOUNTER — Ambulatory Visit: Admitting: Diagnostic Neuroimaging

## 2025-01-05 ENCOUNTER — Ambulatory Visit: Admitting: Nurse Practitioner

## 2025-01-05 ENCOUNTER — Other Ambulatory Visit
